# Patient Record
Sex: Male | Born: 1960 | Hispanic: No | Marital: Married | Smoking: Never smoker
Health system: Southern US, Community
[De-identification: ages and names within clinical notes are randomized; demographics above are authoritative.]

## PROBLEM LIST (undated history)

## (undated) DIAGNOSIS — I214 Non-ST elevation (NSTEMI) myocardial infarction: Secondary | ICD-10-CM

## (undated) DIAGNOSIS — E785 Hyperlipidemia, unspecified: Secondary | ICD-10-CM

## (undated) HISTORY — PX: KNEE ARTHROSCOPY WITH ANTERIOR CRUCIATE LIGAMENT (ACL) REPAIR: SHX5644

## (undated) HISTORY — PX: KIDNEY SURGERY: SHX687

---

## 2019-02-20 ENCOUNTER — Emergency Department (HOSPITAL_COMMUNITY): Payer: BC Managed Care – PPO

## 2019-02-20 ENCOUNTER — Inpatient Hospital Stay (HOSPITAL_COMMUNITY)
Admission: EM | Admit: 2019-02-20 | Discharge: 2019-02-23 | DRG: 247 | Disposition: A | Discharge status: Home / Self Care | Payer: BC Managed Care – PPO | Attending: Cardiovascular Disease | Admitting: Cardiovascular Disease

## 2019-02-20 ENCOUNTER — Other Ambulatory Visit: Payer: Self-pay

## 2019-02-20 ENCOUNTER — Encounter (HOSPITAL_COMMUNITY): Payer: Self-pay

## 2019-02-20 DIAGNOSIS — I959 Hypotension, unspecified: Secondary | ICD-10-CM | POA: Diagnosis present

## 2019-02-20 DIAGNOSIS — E785 Hyperlipidemia, unspecified: Secondary | ICD-10-CM | POA: Diagnosis present

## 2019-02-20 DIAGNOSIS — R001 Bradycardia, unspecified: Secondary | ICD-10-CM | POA: Diagnosis present

## 2019-02-20 DIAGNOSIS — Z955 Presence of coronary angioplasty implant and graft: Secondary | ICD-10-CM

## 2019-02-20 DIAGNOSIS — Z7982 Long term (current) use of aspirin: Secondary | ICD-10-CM

## 2019-02-20 DIAGNOSIS — E7800 Pure hypercholesterolemia, unspecified: Secondary | ICD-10-CM | POA: Diagnosis not present

## 2019-02-20 DIAGNOSIS — I214 Non-ST elevation (NSTEMI) myocardial infarction: Principal | ICD-10-CM | POA: Diagnosis present

## 2019-02-20 DIAGNOSIS — Z8249 Family history of ischemic heart disease and other diseases of the circulatory system: Secondary | ICD-10-CM

## 2019-02-20 DIAGNOSIS — I2510 Atherosclerotic heart disease of native coronary artery without angina pectoris: Secondary | ICD-10-CM | POA: Diagnosis present

## 2019-02-20 DIAGNOSIS — Z79899 Other long term (current) drug therapy: Secondary | ICD-10-CM | POA: Diagnosis not present

## 2019-02-20 HISTORY — DX: Hyperlipidemia, unspecified: E78.5

## 2019-02-20 HISTORY — DX: Non-ST elevation (NSTEMI) myocardial infarction: I21.4

## 2019-02-20 LAB — COMPREHENSIVE METABOLIC PANEL
ALT: 44 U/L (ref 0–44)
AST: 45 U/L — ABNORMAL HIGH (ref 15–41)
Albumin: 3.9 g/dL (ref 3.5–5.0)
Alkaline Phosphatase: 48 U/L (ref 38–126)
Anion gap: 9 (ref 5–15)
BUN: 15 mg/dL (ref 6–20)
CO2: 23 mmol/L (ref 22–32)
Calcium: 8.9 mg/dL (ref 8.9–10.3)
Chloride: 101 mmol/L (ref 98–111)
Creatinine, Ser: 0.93 mg/dL (ref 0.61–1.24)
GFR calc Af Amer: >60 mL/min (ref >60)
GFR calc non Af Amer: >60 mL/min (ref >60)
Glucose, Bld: 142 mg/dL — ABNORMAL HIGH (ref 70–99)
Potassium: 3.8 mmol/L (ref 3.5–5.1)
Sodium: 133 mmol/L — ABNORMAL LOW (ref 135–145)
Total Bilirubin: 0.6 mg/dL (ref 0.3–1.2)
Total Protein: 6.8 g/dL (ref 6.5–8.1)

## 2019-02-20 LAB — TROPONIN I: Troponin I: 0.17 ng/mL (ref <0.03)

## 2019-02-20 LAB — CBG MONITORING, ED: Glucose-Capillary: 154 mg/dL — ABNORMAL HIGH (ref 70–99)

## 2019-02-20 LAB — D-DIMER, QUANTITATIVE: D-Dimer, Quant: 0.44 ug/mL-FEU (ref 0.00–0.50)

## 2019-02-20 LAB — CBC
HCT: 42.9 % (ref 39.0–52.0)
Hemoglobin: 14.4 g/dL (ref 13.0–17.0)
MCH: 28.6 pg (ref 26.0–34.0)
MCHC: 33.6 g/dL (ref 30.0–36.0)
MCV: 85.1 fL (ref 80.0–100.0)
Platelets: 184 10*3/uL (ref 150–400)
RBC: 5.04 MIL/uL (ref 4.22–5.81)
RDW: 12.8 % (ref 11.5–15.5)
WBC: 8.5 10*3/uL (ref 4.0–10.5)
nRBC: 0 % (ref 0.0–0.2)

## 2019-02-20 LAB — LIPASE, BLOOD: Lipase: 47 U/L (ref 11–51)

## 2019-02-20 MED ORDER — HEPARIN BOLUS VIA INFUSION
4000.0000 [IU] | Freq: Once | INTRAVENOUS | Status: AC
  Administered 2019-02-20: 4000 [IU] via INTRAVENOUS
  Filled 2019-02-20: qty 4000

## 2019-02-20 MED ORDER — ASPIRIN 81 MG PO CHEW: 243.0000 mg | CHEWABLE_TABLET | Freq: Once | ORAL | Status: DC

## 2019-02-20 MED ORDER — NITROGLYCERIN 0.4 MG SL SUBL: 0.4000 mg | SUBLINGUAL_TABLET | SUBLINGUAL | Status: DC | PRN

## 2019-02-20 MED ORDER — ASPIRIN EC 81 MG PO TBEC
81.0000 mg | DELAYED_RELEASE_TABLET | Freq: Every day | ORAL | Status: DC
  Administered 2019-02-21 – 2019-02-23 (×2): 81 mg via ORAL
  Filled 2019-02-20 (×2): qty 1

## 2019-02-20 MED ORDER — ASPIRIN 81 MG PO CHEW
324.0000 mg | CHEWABLE_TABLET | Freq: Once | ORAL | Status: AC
  Administered 2019-02-20: 324 mg via ORAL
  Filled 2019-02-20: qty 4

## 2019-02-20 MED ORDER — ONDANSETRON HCL 4 MG/2ML IJ SOLN: 4.0000 mg | Freq: Four times a day (QID) | INTRAMUSCULAR | Status: DC | PRN

## 2019-02-20 MED ORDER — HEPARIN (PORCINE) 25000 UT/250ML-% IV SOLN
900.0000 [IU]/h | INTRAVENOUS | Status: DC
  Administered 2019-02-20 – 2019-02-21 (×2): 900 [IU]/h via INTRAVENOUS
  Filled 2019-02-20 (×2): qty 250

## 2019-02-20 MED ORDER — ATORVASTATIN CALCIUM 80 MG PO TABS
80.0000 mg | ORAL_TABLET | Freq: Every day | ORAL | Status: DC
  Administered 2019-02-20 – 2019-02-22 (×3): 80 mg via ORAL
  Filled 2019-02-20 (×3): qty 1

## 2019-02-20 MED ORDER — ACETAMINOPHEN 325 MG PO TABS: 650.0000 mg | ORAL_TABLET | ORAL | Status: DC | PRN

## 2019-02-20 MED ORDER — LACTATED RINGERS IV BOLUS
1000.0000 mL | Freq: Once | INTRAVENOUS | Status: AC
  Administered 2019-02-20: 1000 mL via INTRAVENOUS

## 2019-02-20 NOTE — ED Provider Notes (Signed)
[facility] EMERGENCY DEPARTMENT Provider Note   CSN: [id number] Arrival date & time: 02/20/19  1843    History   Chief Complaint Chief Complaint  Patient presents with  . Chest Pain    HPI RONNELL CLINGER is a 58 y.o. male who presents the emergency department complaining of centrally located chest tightness that started 9 hours ago.  Patient states that he ran 2 miles on the treadmill last night and felt fine.  He woke up this morning and did 2 sets of push-ups and then went for a walk.  He states that while on the walk he began to feel a tightness located in the center portion of his chest which does not radiate to any other location.  He states that he was unable to finish the walk secondary to the pain.  He states that it has been constant since onset with some waxing and waning however it has remained persistent.  He denies any shortness of breath, cough, diaphoresis associated with it.  He also denies any recent fevers, chills, nausea/vomiting/diarrhea, abdominal pain, or changes in bowel or bladder habits.  No recent travel, unilateral lower extremity swelling, or history of blood clots.      Illness  Onset quality:  Sudden Duration:  9 hours Timing:  Constant Progression:  Waxing and waning Chronicity:  New Associated symptoms: chest pain   Associated symptoms: no abdominal pain, no cough, no ear pain, no fever, no rash, no shortness of breath, no sore throat and no vomiting     History reviewed. No pertinent past medical history.  Patient Active Problem List   Diagnosis Date Noted  . NSTEMI (non-ST elevated myocardial infarction) ([place]) 02/20/2019    Past Surgical History:  Procedure Laterality Date  . KIDNEY SURGERY    . KNEE ARTHROSCOPY WITH ANTERIOR CRUCIATE LIGAMENT (ACL) REPAIR          Home Medications    Prior to Admission medications   Medication Sig Start Date End Date Taking? Authorizing Provider  aspirin EC 81 MG tablet Take 81 mg  by mouth daily.   Yes [provider]  escitalopram (LEXAPRO) 10 MG tablet Take 5 mg by mouth daily.   Yes [provider]  Multiple Vitamin (MULTIVITAMIN WITH MINERALS) TABS tablet Take 1 tablet by mouth daily.   Yes [provider]    Family History No family history on file.  Social History Social History   Tobacco Use  . Smoking status: Never Smoker  . Smokeless tobacco: Never Used  Substance Use Topics  . Alcohol use: Yes    Alcohol/week: 3.0 standard drinks    Types: 3 Glasses of wine per week  . Drug use: Never     Allergies   Patient has no known allergies.   Review of Systems Review of Systems  Constitutional: Negative for chills and fever.  HENT: Negative for ear pain and sore throat.   Eyes: Negative for pain and visual disturbance.  Respiratory: Positive for chest tightness. Negative for cough and shortness of breath.   Cardiovascular: Positive for chest pain. Negative for palpitations.  Gastrointestinal: Negative for abdominal pain and vomiting.  Genitourinary: Negative for dysuria and hematuria.  Musculoskeletal: Negative for arthralgias and back pain.  Skin: Negative for color change and rash.  Neurological: Negative for seizures and syncope.  All other systems reviewed and are negative.    Physical Exam Updated Vital Signs BP 98/60   Pulse (!) 47   Temp 97.9  F (36.6 C) (Oral)   Resp 15   Ht 5\' 9"  (1.753 m)   Wt 78 kg   SpO2 100%   BMI 25.40 kg/m   Physical Exam Vitals signs and nursing note reviewed.  Constitutional:      Appearance: He is well-developed.  HENT:     Head: Normocephalic and atraumatic.  Eyes:     Conjunctiva/sclera: Conjunctivae normal.  Neck:     Musculoskeletal: Neck supple.  Cardiovascular:     Rate and Rhythm: Normal rate and regular rhythm.     Heart sounds: No murmur.  Pulmonary:     Effort: Pulmonary effort is normal. No respiratory distress.     Breath sounds: Normal breath  sounds.  Abdominal:     Palpations: Abdomen is soft.     Tenderness: There is no abdominal tenderness.  Musculoskeletal:     Right lower leg: No edema.     Left lower leg: No edema.     Comments: No tenderness palpation over the chest wall.  Skin:    General: Skin is warm and dry.  Neurological:     General: No focal deficit present.     Mental Status: He is alert and oriented to person, place, and time.     Cranial Nerves: No cranial nerve deficit.      ED Treatments / Results  Labs (all labs ordered are listed, but only abnormal results are displayed) Labs Reviewed  TROPONIN I - Abnormal; Notable for the following components:      Result Value   Troponin I 0.17 (*)    All other components within normal limits  COMPREHENSIVE METABOLIC PANEL - Abnormal; Notable for the following components:   Sodium 133 (*)    Glucose, Bld 142 (*)    AST 45 (*)    All other components within normal limits  CBG MONITORING, ED - Abnormal; Notable for the following components:   Glucose-Capillary 154 (*)    All other components within normal limits  CBC  LIPASE, BLOOD  D-DIMER, QUANTITATIVE (NOT AT [facility])  HEPARIN LEVEL (UNFRACTIONATED)  CBC  HEMOGLOBIN F5D  BASIC METABOLIC PANEL  LIPID PANEL  TROPONIN I  HIV ANTIBODY (ROUTINE TESTING W REFLEX)    EKG EKG Interpretation  Date/Time:  Saturday February 20 2019 20:12:48 EDT Ventricular Rate:  50 PR Interval:    QRS Duration: 96 QT Interval:  438 QTC Calculation: 400 R Axis:   72 Text Interpretation:  Sinus rhythm Minimal ST depression, inferior leads No significant change since last tracing Confirmed by Deno Etienne [phone]) on 02/20/2019 9:12:42 PM   Radiology Dg Chest Portable 1 View  Result Date: 02/20/2019 CLINICAL DATA:  Chest pain. EXAM: PORTABLE CHEST 1 VIEW COMPARISON:  None. FINDINGS: Lungs are adequately inflated and otherwise clear. Cardiomediastinal silhouette and remainder of the exam is normal. IMPRESSION: No active  disease. Electronically Signed   By: Marin Olp M.D.   On: 02/20/2019 20:00    Procedures Procedures (including critical care time)  Medications Ordered in ED Medications  heparin bolus via infusion 4,000 Units (has no administration in time range)  heparin ADULT infusion 100 units/mL (25000 units/231mL sodium chloride 0.45%) (has no administration in time range)  aspirin EC tablet 81 mg (has no administration in time range)  acetaminophen (TYLENOL) tablet 650 mg (has no administration in time range)  ondansetron (ZOFRAN) injection 4 mg (has no administration in time range)  atorvastatin (LIPITOR) tablet 80 mg (has no administration in time range)  aspirin  chewable tablet 324 mg (324 mg Oral Given 02/20/19 1941)  lactated ringers bolus 1,000 mL (1,000 mLs Intravenous New Bag/Given 02/20/19 2015)     Initial Impression / Assessment and Plan / ED Course  I have reviewed the triage vital signs and the nursing notes.  Pertinent labs & imaging results that were available during my care of the patient were reviewed by me and considered in my medical decision making (see chart for details).        Patient is a relatively healthy 58 year old male who presents to the emergency department complaining of sudden onset pressure-like chest pain that started while walking earlier today.  He states that he did push-ups prior to onset of pain and thought initially that it was musculoskeletal in nature.  He came to the emergency department as the pain has been persistent now for the past 9 hours and has been unrelenting.  On initial evaluation of the patient he was hemodynamically stable and nontoxic-appearing.  Patient was afebrile, not tachycardic, normotensive, satting appropriately on room air.  Physical exam as detailed above which is remarkable for healthy-appearing middle-aged male in no significant distress.  Lungs are clear to auscultation.  No murmurs rubs or gallops are appreciated.  Equal  pulses in all 4 extremities and no lower extremity edema is present.  Given patient's description of symptoms there is significant concern for potential ACS.  EKG was obtained which shows normal sinus rhythm at 58 bpm.  No evidence of WPW or Brugada.  No ST or T wave abnormalities concerning for acute ischemia. Full dose aspirin given.   CBC and CMP largely markable.  Patient has d-dimer that is within normal limits however troponin is elevated at 0.17.  Heparin infusion initiated.  While emergency department he had additional episode of chest pain and repeat EKG was obtained at that time.  EKG shows sinus bradycardia at 50 bpm.  No ST or T wave abnormalities concerning for acute ischemia.  Patient's blood pressure also began to downtrend so IV fluid bolus was given.  Case was discussed with Dr. Charissa Bash who is in agreement with admission at this time for ongoing evaluation and care of NSTEMI.  Patient was in stable condition at time admission.  Final Clinical Impressions(s) / ED Diagnoses   Final diagnoses:  NSTEMI (non-ST elevated myocardial infarction) [facility])    ED Discharge Orders    None       Tommie Raymond, MD 02/20/19 2131    Deno Etienne, DO 02/20/19 2220

## 2019-02-20 NOTE — ED Notes (Signed)
ED TO INPATIENT HANDOFF REPORT  ED Nurse Name and Phone #: phill Jelene Albano [phone]  S Name/Age/Gender Alexander Le 58 y.o. male Room/Bed: 019C/019C  Code Status   Code Status: Full Code  Home/SNF/Other Home Patient oriented to: self, place, time and situation Is this baseline? Yes   Triage Complete: Triage complete  Chief Complaint Chest Pain  Triage Note Pt from home c/o CP that began this am while exercising; described as heaviness, tightness, located centrally, states it radiates to RIGHT arm (pt states "it just doesn't feel right"); denies sob, diaphoresis; denies being around known sick persons, no covid-19 positive contacts   Allergies No Known Allergies  Level of Care/Admitting Diagnosis ED Disposition    ED Disposition Condition [place]: [place] [[id number]]  Level of Care: Telemetry Cardiac [103]  Diagnosis: NSTEMI (non-ST elevated myocardial infarction) [facility]) [[id number]]  Admitting Physician: Ailene Ards  Attending Physician: Ailene Ards  Estimated length of stay: 3 - 4 days  Certification:: I certify this patient will need inpatient services for at least 2 midnights  PT Class (Do Not Modify): Inpatient [101]  PT Acc Code (Do Not Modify): Private [1]       B Medical/Surgery History History reviewed. No pertinent past medical history. Past Surgical History:  Procedure Laterality Date  . KIDNEY SURGERY    . KNEE ARTHROSCOPY WITH ANTERIOR CRUCIATE LIGAMENT (ACL) REPAIR       A IV Location/Drains/Wounds Patient Lines/Drains/Airways Status   Active Line/Drains/Airways    Name:   Placement date:   Placement time:   Site:   Days:   Peripheral IV 02/20/19 Right Antecubital   02/20/19    1902    Antecubital   less than 1          Intake/Output Last 24 hours No intake or output data in the 24 hours ending 02/20/19 2105  Labs/Imaging Results for orders placed or performed during the  hospital encounter of 02/20/19 (from the past 48 hour(s))  Troponin I - Now Then Q3H     Status: Abnormal   Collection Time: 02/20/19  7:06 PM  Result Value Ref Range   Troponin I 0.17 (HH) <0.03 ng/mL    Comment: CRITICAL RESULT CALLED TO, READ BACK BY AND VERIFIED WITH: Guerry Bruin RN @ 02/20/2019 BY TEMOCHE, H CALLED AT 2016 Performed at [facility], [place] [address], [place], [place] [address] CORRECTED ON 04/04 AT 2045: PREVIOUSLY REPORTED AS 0.17 CRITICAL RESULT CALLED TO, READ BACK BY AND VERIFIED WITH: Guerry Bruin RN @ 02/20/2019 BY TEMOCHE, H   CBC     Status: None   Collection Time: 02/20/19  7:06 PM  Result Value Ref Range   WBC 8.5 4.0 - 10.5 K/uL   RBC 5.04 4.22 - 5.81 MIL/uL   Hemoglobin 14.4 13.0 - 17.0 g/dL   HCT 42.9 39.0 - 52.0 %   MCV 85.1 80.0 - 100.0 fL   MCH 28.6 26.0 - 34.0 pg   MCHC 33.6 30.0 - 36.0 g/dL   RDW 12.8 11.5 - 15.5 %   Platelets 184 150 - 400 K/uL   nRBC 0.0 0.0 - 0.2 %    Comment: Performed at [facility], [place] [address] [place], [place] [address]  Comprehensive metabolic panel     Status: Abnormal   Collection Time: 02/20/19  7:06 PM  Result Value Ref Range   Sodium 133 (L) 135 - 145 mmol/L  Potassium 3.8 3.5 - 5.1 mmol/L   Chloride 101 98 - 111 mmol/L   CO2 23 22 - 32 mmol/L   Glucose, Bld 142 (H) 70 - 99 mg/dL   BUN 15 6 - 20 mg/dL   Creatinine, Ser 0.93 0.61 - 1.24 mg/dL   Calcium 8.9 8.9 - 10.3 mg/dL   Total Protein 6.8 6.5 - 8.1 g/dL   Albumin 3.9 3.5 - 5.0 g/dL   AST 45 (H) 15 - 41 U/L   ALT 44 0 - 44 U/L   Alkaline Phosphatase 48 38 - 126 U/L   Total Bilirubin 0.6 0.3 - 1.2 mg/dL   GFR calc non Af Amer >60 >60 mL/min   GFR calc Af Amer >60 >60 mL/min   Anion gap 9 5 - 15    Comment: Performed at [facility], [address] [place], [place] [address]  Lipase, blood     Status: None   Collection Time: 02/20/19  7:06 PM  Result Value Ref Range   Lipase 47 11 - 51 U/L    Comment: Performed at [facility] [address] [place], [place] [address]  CBG monitoring, ED     Status: Abnormal   Collection Time: 02/20/19  7:21 PM  Result Value Ref Range   Glucose-Capillary 154 (H) 70 - 99 mg/dL   Comment 1 Notify RN    Comment 2 Document in Chart   D-dimer, quantitative (not at [facility])     Status: None   Collection Time: 02/20/19  8:27 PM  Result Value Ref Range   D-Dimer, Quant 0.44 0.00 - 0.50 ug/mL-FEU    Comment: (NOTE) At the manufacturer cut-off of 0.50 ug/mL FEU, this assay has been documented to exclude PE with a sensitivity and negative predictive value of 97 to 99%.  At this time, this assay has not been approved by the FDA to exclude DVT/VTE. Results should be correlated with clinical presentation. Performed at [facility], [place] [address] [place], [place] [address]    Dg Chest Portable 1 View  Result Date: 02/20/2019 CLINICAL DATA:  Chest pain. EXAM: PORTABLE CHEST 1 VIEW COMPARISON:  None. FINDINGS: Lungs are adequately inflated and otherwise clear. Cardiomediastinal silhouette and remainder of the exam is normal. IMPRESSION: No active disease. Electronically Signed   By: Marin Olp M.D.   On: 02/20/2019 20:00    Pending Labs Unresulted Labs (From admission, onward)    Start     Ordered   02/22/19 0500  Heparin level (unfractionated)  Daily,   R     02/20/19 2032   02/21/19 0600  Lipid panel  Once,   R     02/20/19 2049   02/21/19 0600  Hemoglobin A1c  Once,   R     02/20/19 2049   02/21/19 4967  Basic metabolic panel  Once,   R     02/20/19 2049   02/21/19 0500  CBC  Daily,   R     02/20/19 2030   02/21/19 0300  Heparin level (unfractionated)  Once-Timed,   R     02/20/19 2030   02/20/19 2045  HIV antibody (Routine Testing)  Once,   R     02/20/19 2046   02/20/19 1908  Troponin I - Now Then Q3H  Now then every 3 hours,   STAT     02/20/19 1909          Vitals/Pain Today's Vitals   02/20/19  1856 02/20/19 1859 02/20/19 1930 02/20/19 1946  BP:    105/61   Pulse:   (!) 44   Resp:   17   Temp:  97.9 F (36.6 C)    TempSrc:  Oral    SpO2:   98%   Weight:  78 kg    Height:  5\' 9"  (1.753 m)    PainSc: 4    5     Isolation Precautions No active isolations  Medications Medications  heparin bolus via infusion 4,000 Units (has no administration in time range)  heparin ADULT infusion 100 units/mL (25000 units/269mL sodium chloride 0.45%) (has no administration in time range)  aspirin EC tablet 81 mg (has no administration in time range)  nitroGLYCERIN (NITROSTAT) SL tablet 0.4 mg (has no administration in time range)  acetaminophen (TYLENOL) tablet 650 mg (has no administration in time range)  ondansetron (ZOFRAN) injection 4 mg (has no administration in time range)  aspirin chewable tablet 324 mg (324 mg Oral Given 02/20/19 1941)  lactated ringers bolus 1,000 mL (1,000 mLs Intravenous New Bag/Given 02/20/19 2015)    Mobility walks     Focused Assessments Cardiac Assessment Handoff:  Cardiac Rhythm: Normal sinus rhythm Lab Results  Component Value Date   TROPONINI 0.17 (HH) 02/20/2019   Lab Results  Component Value Date   DDIMER 0.44 02/20/2019   Does the Patient currently have chest pain? Yes     R Recommendations: See Admitting Provider Note  Report given to:   Additional Notes: Positive troponin levels.0.17

## 2019-02-20 NOTE — Progress Notes (Signed)
Wife: Izik Bingman, cell: [phone]--[facility] of no visitor policy

## 2019-02-20 NOTE — H&P (Signed)
Cardiology History & Physical    Patient ID: Alexander Le MRN: [id number], DOB: 12-30-1960 Date of Encounter: 02/20/2019, 8:47 PM Primary Physician: System, Pcp Not In Primary Cardiologist: No primary care provider on file. Primary Electrophysiologist:  None  Chief Complaint: chest pressure Reason for Admission: NSTEMI Requesting MD: Tommie Raymond, MD  HPI: Alexander Le is a 59 y.o. male with no significant past medical history who presents for evaluation of chest pressure.  Patient did a couple of sets of push-ups this morning.  He then went for a walk with his wife.  During the walk he developed midsternal chest pressure and difficulty breathing.  He was unable to complete the walk, and his wife had to go get the car and pick him up.  He then came to the emergency room for further evaluation.  EKG here showed sinus rhythm with minimal ST depression in the inferior leads.  Initial troponin came back 0.17.  Vital signs notable for mild bradycardia and mild hypotension.  Patient is unclear what his baseline blood pressure is, but thinks it is lower than normal as he has experienced some lightheadedness while here.  He was given 324 mg aspirin and IV heparin has been ordered.  Currently his chest pain has resolved.  It has happened intermittently a few times while in the ED.  He denies tobacco use or history of hypertension or diabetes.  His mother had heart problems in her 18s, and he has a brother that had a heart attack in his 83s.  History reviewed. No pertinent past medical history.   Surgical History:  Past Surgical History:  Procedure Laterality Date  . KIDNEY SURGERY    . KNEE ARTHROSCOPY WITH ANTERIOR CRUCIATE LIGAMENT (ACL) REPAIR       Home Meds: Prior to Admission medications   Medication Sig Start Date End Date Taking? Authorizing Provider  aspirin EC 81 MG tablet Take 81 mg by mouth daily.   Yes [provider]  PRESCRIPTION MEDICATION Antidepressant filled  overseas   Yes [provider]    Allergies: No Known Allergies   Social history: No tobacco use.  He works in [employer] and is working from home.  Family history: See HPI  Review of Systems: General: negative for chills, fever, night sweats or weight changes.  Cardiovascular: Positive for chest pain Dermatological: negative for rash Respiratory: negative for cough or wheezing Urologic: negative for hematuria Abdominal: negative for nausea, vomiting, diarrhea, bright red blood per rectum, melena, or hematemesis Neurologic: negative for visual changes, syncope, or dizziness All other systems reviewed and are otherwise negative except as noted above.  Labs:   Lab Results  Component Value Date   WBC 8.5 02/20/2019   HGB 14.4 02/20/2019   HCT 42.9 02/20/2019   MCV 85.1 02/20/2019   PLT 184 02/20/2019    Recent Labs  Lab 02/20/19 1906  NA 133*  K 3.8  CL 101  CO2 23  BUN 15  CREATININE 0.93  CALCIUM 8.9  PROT 6.8  BILITOT 0.6  ALKPHOS 48  ALT 44  AST 45*  GLUCOSE 142*   Recent Labs    02/20/19 1906  TROPONINI 0.17*   No results found for: CHOL, HDL, LDLCALC, TRIG No results found for: DDIMER  Radiology/Studies:  Dg Chest Portable 1 View  Result Date: 02/20/2019 CLINICAL DATA:  Chest pain. EXAM: PORTABLE CHEST 1 VIEW COMPARISON:  None. FINDINGS: Lungs are adequately inflated and otherwise clear. Cardiomediastinal silhouette and remainder of the  exam is normal. IMPRESSION: No active disease. Electronically Signed   By: Marin Olp M.D.   On: 02/20/2019 20:00   Wt Readings from Last 3 Encounters:  02/20/19 78 kg    EKG: See HPI  Physical Exam: Blood pressure 105/61, pulse (!) 44, temperature 97.9 F (36.6 C), temperature source Oral, resp. rate 17, height 5\' 9"  (1.753 m), weight 78 kg, SpO2 98 %. Body mass index is 25.4 kg/m. General: Well developed, well nourished, in no acute distress. Head: Normocephalic, atraumatic, sclera non-icteric, no  xanthomas, nares are without discharge.  Neck: JVD not elevated. Lungs: Breathing is unlabored. Heart: Auscultation not performed Abdomen: Nondistended Msk:  Strength and tone appear normal for age. Extremities: No clubbing or cyanosis. No edema.  Distal pedal pulses are 2+ and equal bilaterally. Neuro: Alert and oriented X 3. No focal deficit. No facial asymmetry. Moves all extremities spontaneously. Psych:  Responds to questions appropriately with a normal affect.    Assessment and Plan  1.  NSTEMI Typical chest pressure symptoms with elevated troponin.  EKG is normal appearing with perhaps small ST segment depression inferiorly, not meeting criteria for ischemia.  Only risk factor is family history with a brother with MI in his 59s.  We will plan IV heparin and echocardiogram tomorrow.  Likely cath on Monday.  Currently symptom-free.  Probably will not tolerate beta-blocker or nitroglycerin with current vital signs.  --IV heparin for ACS --Echocardiogram --Likely cath on Monday --Aspirin 81, atorvastatin 80 --We will get lipid panel, A1c, and repeat troponin tomorrow morning  Severity of Illness: The appropriate patient status for this patient is INPATIENT. Inpatient status is judged to be reasonable and necessary in order to provide the required intensity of service to ensure the patient's safety. The patient's presenting symptoms, physical exam findings, and initial radiographic and laboratory data in the context of their chronic comorbidities is felt to place them at high risk for further clinical deterioration. Furthermore, it is not anticipated that the patient will be medically stable for discharge from the hospital within 2 midnights of admission. The following factors support the patient status of inpatient.   " The patient's presenting symptoms include chest pain. " The worrisome physical exam findings include none. " The initial radiographic and laboratory data are worrisome  because of abnormal troponin. " The chronic co-morbidities include none.   * I certify that at the point of admission it is my clinical judgment that the patient will require inpatient hospital care spanning beyond 2 midnights from the point of admission due to high intensity of service, high risk for further deterioration and high frequency of surveillance required.*    For questions or updates, please contact [place] Please consult [email or web address] for contact info under Cardiology/STEMI.  Liliane Bade, MD 02/20/2019, 8:47 PM

## 2019-02-20 NOTE — Progress Notes (Signed)
Pt C/O "chest heaviness" started today after run and multiple sets of push-ups.  Denies SOB, fever, cough.

## 2019-02-20 NOTE — ED Triage Notes (Signed)
Pt from home c/o CP that began this am while exercising; described as heaviness, tightness, located centrally, states it radiates to RIGHT arm (pt states "it just doesn't feel right"); denies sob, diaphoresis; denies being around known sick persons, no covid-19 positive contacts

## 2019-02-20 NOTE — Progress Notes (Signed)
ANTICOAGULATION CONSULT NOTE - Initial Consult  Pharmacy Consult for heparin Indication: chest pain/ACS  No Known Allergies  Patient Measurements: Height: 5\' 9"  (175.3 cm) Weight: 172 lb (78 kg) IBW/kg (Calculated) : 70.7 Heparin Dosing Weight: 78 kg  Vital Signs: Temp: 97.9 F (36.6 C) (04/04 1859) Temp Source: Oral (04/04 1859) BP: 105/61 (04/04 1930) Pulse Rate: 44 (04/04 1930)  Labs: Recent Labs    02/20/19 1906  HGB 14.4  HCT 42.9  PLT 184  CREATININE 0.93  TROPONINI 0.17*    Estimated Creatinine Clearance: 88.7 mL/min (by C-G formula based on SCr of 0.93 mg/dL).   Medical History: History reviewed. No pertinent past medical history.  Assessment: 43 yom complaining of waxing/waning chest pain s/p exercising. No AC PTA.  Hgb 14.4, plt 184. Troponin 0.17. No s/sx of bleeding.   Goal of Therapy:  Heparin level 0.3-0.7 units/ml Monitor platelets by anticoagulation protocol: Yes   Plan:  Give 4000 units bolus x 1 Start heparin infusion at 900 units/hr Check anti-Xa level in 6 hours and daily while on heparin Continue to monitor H&H and platelets  Antonietta Jewel, PharmD, [facility] Clinical Pharmacist  Pager: [phone] Phone: [phone] 02/20/2019,8:27 PM

## 2019-02-21 ENCOUNTER — Encounter (HOSPITAL_COMMUNITY): Payer: Self-pay

## 2019-02-21 ENCOUNTER — Inpatient Hospital Stay (HOSPITAL_COMMUNITY): Payer: BC Managed Care – PPO

## 2019-02-21 DIAGNOSIS — I214 Non-ST elevation (NSTEMI) myocardial infarction: Secondary | ICD-10-CM

## 2019-02-21 LAB — BASIC METABOLIC PANEL
Anion gap: 10 (ref 5–15)
BUN: 16 mg/dL (ref 6–20)
CO2: 22 mmol/L (ref 22–32)
Calcium: 8.8 mg/dL — ABNORMAL LOW (ref 8.9–10.3)
Chloride: 103 mmol/L (ref 98–111)
Creatinine, Ser: 1.04 mg/dL (ref 0.61–1.24)
GFR calc Af Amer: >60 mL/min (ref >60)
GFR calc non Af Amer: >60 mL/min (ref >60)
Glucose, Bld: 114 mg/dL — ABNORMAL HIGH (ref 70–99)
Potassium: 4.4 mmol/L (ref 3.5–5.1)
Sodium: 135 mmol/L (ref 135–145)

## 2019-02-21 LAB — LIPID PANEL
Cholesterol: 206 mg/dL — ABNORMAL HIGH (ref 0–200)
HDL: 46 mg/dL (ref >40)
LDL Cholesterol: 142 mg/dL — ABNORMAL HIGH (ref 0–99)
Total CHOL/HDL Ratio: 4.5 RATIO
Triglycerides: 89 mg/dL (ref <150)
VLDL: 18 mg/dL (ref 0–40)

## 2019-02-21 LAB — CBC
HCT: 40.2 % (ref 39.0–52.0)
Hemoglobin: 13.3 g/dL (ref 13.0–17.0)
MCH: 28.1 pg (ref 26.0–34.0)
MCHC: 33.1 g/dL (ref 30.0–36.0)
MCV: 84.8 fL (ref 80.0–100.0)
Platelets: 156 10*3/uL (ref 150–400)
RBC: 4.74 MIL/uL (ref 4.22–5.81)
RDW: 12.9 % (ref 11.5–15.5)
WBC: 9 10*3/uL (ref 4.0–10.5)
nRBC: 0.3 % — ABNORMAL HIGH (ref 0.0–0.2)

## 2019-02-21 LAB — HEMOGLOBIN A1C
Hgb A1c MFr Bld: 4.9 % (ref 4.8–5.6)
Mean Plasma Glucose: 93.93 mg/dL

## 2019-02-21 LAB — ECHOCARDIOGRAM LIMITED
Height: 69 in
Weight: 2798.4 oz

## 2019-02-21 LAB — HEPARIN LEVEL (UNFRACTIONATED)
Heparin Unfractionated: 0.39 IU/mL (ref 0.30–0.70)
Heparin Unfractionated: 0.38 IU/mL (ref 0.30–0.70)

## 2019-02-21 LAB — HIV ANTIBODY (ROUTINE TESTING W REFLEX): HIV Screen 4th Generation wRfx: NONREACTIVE

## 2019-02-21 LAB — TROPONIN I: Troponin I: 2.81 ng/mL (ref <0.03)

## 2019-02-21 MED ORDER — SODIUM CHLORIDE 0.9 % IV BOLUS
250.0000 mL | Freq: Once | INTRAVENOUS | Status: AC
  Administered 2019-02-21: 18:00:00 250 mL via INTRAVENOUS

## 2019-02-21 MED ORDER — SODIUM CHLORIDE 0.9% FLUSH: 3.0000 mL | Freq: Two times a day (BID) | INTRAVENOUS | Status: DC

## 2019-02-21 MED ORDER — SODIUM CHLORIDE 0.9% FLUSH: 3.0000 mL | INTRAVENOUS | Status: DC | PRN

## 2019-02-21 MED ORDER — ASPIRIN 81 MG PO CHEW
81.0000 mg | CHEWABLE_TABLET | ORAL | Status: AC
  Administered 2019-02-22: 06:00:00 81 mg via ORAL
  Filled 2019-02-21: qty 1

## 2019-02-21 MED ORDER — SODIUM CHLORIDE 0.9 % WEIGHT BASED INFUSION
1.0000 mL/kg/h | INTRAVENOUS | Status: DC
  Administered 2019-02-22: 1 mL/kg/h via INTRAVENOUS

## 2019-02-21 MED ORDER — SODIUM CHLORIDE 0.9 % IV SOLN: 250.0000 mL | INTRAVENOUS | Status: DC | PRN

## 2019-02-21 NOTE — Progress Notes (Signed)
BP went up to 93/67 after 250 ml bolos.  Idolina Primer, RN

## 2019-02-21 NOTE — Progress Notes (Deleted)
Pt stood up with a walker and 2 person assist and transferred in a recliner.  Pt having lunch in a recliner.  Purewick leaked on a bed.  The output is not accurate.   Idolina Primer, RN

## 2019-02-21 NOTE — Progress Notes (Signed)
CRITICAL VALUE ALERT Troponin Critical Value:  2.81  Date & Time Notied:  02/21/2019  0451  Provider Notified: Rhae Hammock  Orders Received/Actions taken:

## 2019-02-21 NOTE — Progress Notes (Signed)
SBP low in 70s. Checked manually. Received order to give 240ml of NS bolos x1 from Dr. Raiford Simmonds. Will continue to monitor.  Idolina Primer, RN

## 2019-02-21 NOTE — Progress Notes (Signed)
ANTICOAGULATION CONSULT NOTE - Follow Up Consult  Pharmacy Consult for heparin Indication: NSTEMI   Labs: Recent Labs    02/20/19 1906 02/21/19 0237  HGB 14.4  --   HCT 42.9  --   PLT 184  --   HEPARINUNFRC  --  0.39  CREATININE 0.93  --   TROPONINI 0.17*  --     Assessment/Plan:  58yo male therapeutic on heparin with initial dosing for NSTEMI. Will continue gtt at current rate and confirm stable with additional level.   Wynona Neat, PharmD, BCPS  02/21/2019,3:53 AM

## 2019-02-21 NOTE — Progress Notes (Signed)
  Echocardiogram 2D Echocardiogram has been performed.  Johny Chess 02/21/2019, 10:36 AM

## 2019-02-21 NOTE — Progress Notes (Signed)
ANTICOAGULATION CONSULT NOTE - Consult  Pharmacy Consult for heparin Indication: chest pain/ACS  No Known Allergies  Patient Measurements: Height: 5\' 9"  (175.3 cm) Weight: 174 lb 14.4 oz (79.3 kg) IBW/kg (Calculated) : 70.7 Heparin Dosing Weight: 78 kg  Vital Signs: Temp: 97.7 F (36.5 C) (04/05 0505) Temp Source: Oral (04/05 0505) BP: 90/61 (04/05 0505) Pulse Rate: 60 (04/05 0505)  Labs: Recent Labs    02/20/19 1906 02/21/19 0237 02/21/19 0757  HGB 14.4  --  13.3  HCT 42.9  --  40.2  PLT 184  --  156  HEPARINUNFRC  --  0.39 0.38  CREATININE 0.93 1.04  --   TROPONINI 0.17* 2.81*  --     Estimated Creatinine Clearance: 79.3 mL/min (by C-G formula based on SCr of 1.04 mg/dL).   Medical History: History reviewed. No pertinent past medical history.  Assessment: 23 yom complaining of waxing/waning chest pain s/p exercising. No AC PTA.  Hgb 13.3, plt 156. Troponin up to 2.81. No s/sx of bleeding.  Heparin level remains therapeutic this am.  Goal of Therapy:  Heparin level 0.3-0.7 units/ml Monitor platelets by anticoagulation protocol: Yes   Plan:  Continue heparin drip at 900 units/hr Will check heparin level and CBC qam  Alanda Slim, PharmD, [facility] Clinical Pharmacist Please see AMION for all Pharmacists' Contact Phone Numbers 02/21/2019, 8:52 AM

## 2019-02-21 NOTE — H&P (View-Only) (Signed)
Progress Note  Patient Name: Alexander Le Date of Encounter: 02/21/2019  Primary Cardiologist: Dr Stanford Breed  Subjective   No CP or dyspnea  Inpatient Medications    Scheduled Meds: . aspirin EC  81 mg Oral Daily  . atorvastatin  80 mg Oral q1800   Continuous Infusions: . heparin 900 Units/hr (02/20/19 2157)   PRN Meds: acetaminophen, ondansetron (ZOFRAN) IV   Vital Signs    Vitals:   02/20/19 2100 02/20/19 2110 02/20/19 2135 02/21/19 0505  BP: (!) 85/63 98/60 (!) 91/55 90/61  Pulse: (!) 47 (!) 47 (!) 57 60  Resp: 19 15    Temp:   (!) 97.4 F (36.3 C) 97.7 F (36.5 C)  TempSrc:   Oral Oral  SpO2: 99% 100% 99% 98%  Weight:    79.3 kg  Height:        Intake/Output Summary (Last 24 hours) at 02/21/2019 0743 Last data filed at 02/20/2019 2200 Gross per 24 hour  Intake 120 ml  Output -  Net 120 ml   Last 3 Weights 02/21/2019 02/20/2019  Weight (lbs) 174 lb 14.4 oz 172 lb  Weight (kg) 79.334 kg 78.019 kg      Telemetry    Sinus bradycardia- Personally Reviewed   Physical Exam   GEN: No acute distress.   Neck: No JVD Cardiac: RRR, no murmurs, rubs, or gallops.  Respiratory: Clear to auscultation bilaterally. GI: Soft, nontender, non-distended  MS: No edema Neuro:  Nonfocal  Psych: Normal affect   Labs    Chemistry Recent Labs  Lab 02/20/19 1906 02/21/19 0237  NA 133* 135  K 3.8 4.4  CL 101 103  CO2 23 22  GLUCOSE 142* 114*  BUN 15 16  CREATININE 0.93 1.04  CALCIUM 8.9 8.8*  PROT 6.8  --   ALBUMIN 3.9  --   AST 45*  --   ALT 44  --   ALKPHOS 48  --   BILITOT 0.6  --   GFRNONAA >60 >60  GFRAA >60 >60  ANIONGAP 9 10     Hematology Recent Labs  Lab 02/20/19 1906  WBC 8.5  RBC 5.04  HGB 14.4  HCT 42.9  MCV 85.1  MCH 28.6  MCHC 33.6  RDW 12.8  PLT 184    Cardiac Enzymes Recent Labs  Lab 02/20/19 1906 02/21/19 0237  TROPONINI 0.17* 2.81*    DDimer  Recent Labs  Lab 02/20/19 2027  DDIMER 0.44     Radiology    Dg  Chest Portable 1 View  Result Date: 02/20/2019 CLINICAL DATA:  Chest pain. EXAM: PORTABLE CHEST 1 VIEW COMPARISON:  None. FINDINGS: Lungs are adequately inflated and otherwise clear. Cardiomediastinal silhouette and remainder of the exam is normal. IMPRESSION: No active disease. Electronically Signed   By: Marin Olp M.D.   On: 02/20/2019 20:00    Patient Profile     58 y.o. male with no significant past medical history admitted with non-ST elevation myocardial infarction.  Assessment & Plan    1 non-ST elevation myocardial infarction-patient remains pain-free this morning.  Enzymes are abnormal.  Plan to proceed with cardiac catheterization tomorrow morning.  The risks and benefits including myocardial infarction, CVA and death discussed and he agrees to proceed.  Continue aspirin, heparin and statin.  Blood pressure and pulse will not allow beta-blocker.  Echocardiogram for LV function.  For questions or updates, please contact Center Please consult [email or web address] for contact info under  Signed, Kirk Ruths, MD  02/21/2019, 7:43 AM

## 2019-02-21 NOTE — Progress Notes (Signed)
Progress Note  Patient Name: Alexander Le Date of Encounter: 02/21/2019  Primary Cardiologist: Dr Stanford Breed  Subjective   No CP or dyspnea  Inpatient Medications    Scheduled Meds: . aspirin EC  81 mg Oral Daily  . atorvastatin  80 mg Oral q1800   Continuous Infusions: . heparin 900 Units/hr (02/20/19 2157)   PRN Meds: acetaminophen, ondansetron (ZOFRAN) IV   Vital Signs    Vitals:   02/20/19 2100 02/20/19 2110 02/20/19 2135 02/21/19 0505  BP: (!) 85/63 98/60 (!) 91/55 90/61  Pulse: (!) 47 (!) 47 (!) 57 60  Resp: 19 15    Temp:   (!) 97.4 F (36.3 C) 97.7 F (36.5 C)  TempSrc:   Oral Oral  SpO2: 99% 100% 99% 98%  Weight:    79.3 kg  Height:        Intake/Output Summary (Last 24 hours) at 02/21/2019 0743 Last data filed at 02/20/2019 2200 Gross per 24 hour  Intake 120 ml  Output -  Net 120 ml   Last 3 Weights 02/21/2019 02/20/2019  Weight (lbs) 174 lb 14.4 oz 172 lb  Weight (kg) 79.334 kg 78.019 kg      Telemetry    Sinus bradycardia- Personally Reviewed   Physical Exam   GEN: No acute distress.   Neck: No JVD Cardiac: RRR, no murmurs, rubs, or gallops.  Respiratory: Clear to auscultation bilaterally. GI: Soft, nontender, non-distended  MS: No edema Neuro:  Nonfocal  Psych: Normal affect   Labs    Chemistry Recent Labs  Lab 02/20/19 1906 02/21/19 0237  NA 133* 135  K 3.8 4.4  CL 101 103  CO2 23 22  GLUCOSE 142* 114*  BUN 15 16  CREATININE 0.93 1.04  CALCIUM 8.9 8.8*  PROT 6.8  --   ALBUMIN 3.9  --   AST 45*  --   ALT 44  --   ALKPHOS 48  --   BILITOT 0.6  --   GFRNONAA >60 >60  GFRAA >60 >60  ANIONGAP 9 10     Hematology Recent Labs  Lab 02/20/19 1906  WBC 8.5  RBC 5.04  HGB 14.4  HCT 42.9  MCV 85.1  MCH 28.6  MCHC 33.6  RDW 12.8  PLT 184    Cardiac Enzymes Recent Labs  Lab 02/20/19 1906 02/21/19 0237  TROPONINI 0.17* 2.81*    DDimer  Recent Labs  Lab 02/20/19 2027  DDIMER 0.44     Radiology    Dg  Chest Portable 1 View  Result Date: 02/20/2019 CLINICAL DATA:  Chest pain. EXAM: PORTABLE CHEST 1 VIEW COMPARISON:  None. FINDINGS: Lungs are adequately inflated and otherwise clear. Cardiomediastinal silhouette and remainder of the exam is normal. IMPRESSION: No active disease. Electronically Signed   By: Marin Olp M.D.   On: 02/20/2019 20:00    Patient Profile     58 y.o. male with no significant past medical history admitted with non-ST elevation myocardial infarction.  Assessment & Plan    1 non-ST elevation myocardial infarction-patient remains pain-free this morning.  Enzymes are abnormal.  Plan to proceed with cardiac catheterization tomorrow morning.  The risks and benefits including myocardial infarction, CVA and death discussed and he agrees to proceed.  Continue aspirin, heparin and statin.  Blood pressure and pulse will not allow beta-blocker.  Echocardiogram for LV function.  For questions or updates, please contact [place] Please consult [email or web address] for contact info under  Signed, Kirk Ruths, MD  02/21/2019, 7:43 AM

## 2019-02-22 ENCOUNTER — Encounter (HOSPITAL_COMMUNITY)
Admission: EM | Disposition: A | Discharge status: Home / Self Care | Payer: Self-pay | Source: Home / Self Care | Attending: Cardiovascular Disease

## 2019-02-22 DIAGNOSIS — E785 Hyperlipidemia, unspecified: Secondary | ICD-10-CM

## 2019-02-22 DIAGNOSIS — I2510 Atherosclerotic heart disease of native coronary artery without angina pectoris: Secondary | ICD-10-CM

## 2019-02-22 HISTORY — PX: LEFT HEART CATH AND CORONARY ANGIOGRAPHY: CATH118249

## 2019-02-22 HISTORY — PX: CORONARY STENT INTERVENTION: CATH118234

## 2019-02-22 LAB — CBC
HCT: 39.7 % (ref 39.0–52.0)
Hemoglobin: 12.9 g/dL — ABNORMAL LOW (ref 13.0–17.0)
MCH: 28 pg (ref 26.0–34.0)
MCHC: 32.5 g/dL (ref 30.0–36.0)
MCV: 86.3 fL (ref 80.0–100.0)
Platelets: 133 10*3/uL — ABNORMAL LOW (ref 150–400)
RBC: 4.6 MIL/uL (ref 4.22–5.81)
RDW: 13.2 % (ref 11.5–15.5)
WBC: 8.6 10*3/uL (ref 4.0–10.5)
nRBC: 0 % (ref 0.0–0.2)

## 2019-02-22 LAB — BASIC METABOLIC PANEL
Anion gap: 7 (ref 5–15)
BUN: 15 mg/dL (ref 6–20)
CO2: 26 mmol/L (ref 22–32)
Calcium: 8.4 mg/dL — ABNORMAL LOW (ref 8.9–10.3)
Chloride: 106 mmol/L (ref 98–111)
Creatinine, Ser: 1.07 mg/dL (ref 0.61–1.24)
GFR calc Af Amer: >60 mL/min (ref >60)
GFR calc non Af Amer: >60 mL/min (ref >60)
Glucose, Bld: 102 mg/dL — ABNORMAL HIGH (ref 70–99)
Potassium: 4.3 mmol/L (ref 3.5–5.1)
Sodium: 139 mmol/L (ref 135–145)

## 2019-02-22 LAB — POCT ACTIVATED CLOTTING TIME: Activated Clotting Time: 290 seconds

## 2019-02-22 LAB — HEPARIN LEVEL (UNFRACTIONATED): Heparin Unfractionated: 0.29 IU/mL — ABNORMAL LOW (ref 0.30–0.70)

## 2019-02-22 SURGERY — LEFT HEART CATH AND CORONARY ANGIOGRAPHY
Anesthesia: LOCAL

## 2019-02-22 MED ORDER — HEPARIN SODIUM (PORCINE) 1000 UNIT/ML IJ SOLN
INTRAMUSCULAR | Status: AC
  Filled 2019-02-22: qty 1

## 2019-02-22 MED ORDER — SODIUM CHLORIDE 0.9% FLUSH: 3.0000 mL | INTRAVENOUS | Status: DC | PRN

## 2019-02-22 MED ORDER — FENTANYL CITRATE (PF) 100 MCG/2ML IJ SOLN
INTRAMUSCULAR | Status: AC
  Filled 2019-02-22: qty 2

## 2019-02-22 MED ORDER — VERAPAMIL HCL 2.5 MG/ML IV SOLN
INTRAVENOUS | Status: DC | PRN
  Administered 2019-02-22: 10 mL via INTRA_ARTERIAL

## 2019-02-22 MED ORDER — ENOXAPARIN SODIUM 40 MG/0.4ML ~~LOC~~ SOLN
40.0000 mg | SUBCUTANEOUS | Status: DC
  Filled 2019-02-22: qty 0.4

## 2019-02-22 MED ORDER — MIDAZOLAM HCL 2 MG/2ML IJ SOLN
INTRAMUSCULAR | Status: DC | PRN
  Administered 2019-02-22: 1 mg via INTRAVENOUS

## 2019-02-22 MED ORDER — SODIUM CHLORIDE 0.9 % WEIGHT BASED INFUSION
1.0000 mL/kg/h | INTRAVENOUS | Status: AC
  Administered 2019-02-22: 13:00:00 1 mL/kg/h via INTRAVENOUS

## 2019-02-22 MED ORDER — SODIUM CHLORIDE 0.9 % IV SOLN: 250.0000 mL | INTRAVENOUS | Status: DC | PRN

## 2019-02-22 MED ORDER — HEPARIN (PORCINE) IN NACL 1000-0.9 UT/500ML-% IV SOLN
INTRAVENOUS | Status: AC
  Filled 2019-02-22: qty 1000

## 2019-02-22 MED ORDER — TICAGRELOR 90 MG PO TABS
ORAL_TABLET | ORAL | Status: AC
  Filled 2019-02-22: qty 1

## 2019-02-22 MED ORDER — HEPARIN SODIUM (PORCINE) 1000 UNIT/ML IJ SOLN
INTRAMUSCULAR | Status: DC | PRN
  Administered 2019-02-22 (×2): 4000 [IU] via INTRAVENOUS

## 2019-02-22 MED ORDER — TICAGRELOR 90 MG PO TABS
ORAL_TABLET | ORAL | Status: DC | PRN
  Administered 2019-02-22: 180 mg via ORAL

## 2019-02-22 MED ORDER — HEPARIN (PORCINE) IN NACL 1000-0.9 UT/500ML-% IV SOLN
INTRAVENOUS | Status: DC | PRN
  Administered 2019-02-22 (×2): 500 mL

## 2019-02-22 MED ORDER — FENTANYL CITRATE (PF) 100 MCG/2ML IJ SOLN
INTRAMUSCULAR | Status: DC | PRN
  Administered 2019-02-22: 25 ug via INTRAVENOUS

## 2019-02-22 MED ORDER — IOHEXOL 350 MG/ML SOLN
INTRAVENOUS | Status: DC | PRN
  Administered 2019-02-22: 130 mL via INTRAVENOUS

## 2019-02-22 MED ORDER — LIDOCAINE HCL (PF) 1 % IJ SOLN
INTRAMUSCULAR | Status: AC
  Filled 2019-02-22: qty 30

## 2019-02-22 MED ORDER — LIDOCAINE HCL (PF) 1 % IJ SOLN
INTRAMUSCULAR | Status: DC | PRN
  Administered 2019-02-22: 2 mL

## 2019-02-22 MED ORDER — MIDAZOLAM HCL 2 MG/2ML IJ SOLN
INTRAMUSCULAR | Status: AC
  Filled 2019-02-22: qty 2

## 2019-02-22 MED ORDER — VERAPAMIL HCL 2.5 MG/ML IV SOLN
INTRAVENOUS | Status: AC
  Filled 2019-02-22: qty 2

## 2019-02-22 MED ORDER — SODIUM CHLORIDE 0.9% FLUSH
3.0000 mL | Freq: Two times a day (BID) | INTRAVENOUS | Status: DC
  Administered 2019-02-22: 18:00:00 3 mL via INTRAVENOUS
  Administered 2019-02-23: 09:00:00 via INTRAVENOUS

## 2019-02-22 MED ORDER — TICAGRELOR 90 MG PO TABS
90.0000 mg | ORAL_TABLET | Freq: Two times a day (BID) | ORAL | Status: DC
  Administered 2019-02-22 – 2019-02-23 (×2): 90 mg via ORAL
  Filled 2019-02-22 (×2): qty 1

## 2019-02-22 SURGICAL SUPPLY — 18 items
BALLN SAPPHIRE 2.5X12 (BALLOONS) ×2
BALLN ~~LOC~~ EMERGE MR 3.0X20 (BALLOONS) ×2
BALLOON SAPPHIRE 2.5X12 (BALLOONS) ×1 IMPLANT
BALLOON ~~LOC~~ EMERGE MR 3.0X20 (BALLOONS) ×1 IMPLANT
CATH 5FR JL3.5 JR4 ANG PIG MP (CATHETERS) ×2 IMPLANT
CATHETER LAUNCHER 6FR RCB (CATHETERS) ×2 IMPLANT
COVER DOME SNAP 22 D (MISCELLANEOUS) ×2 IMPLANT
DEVICE RAD COMP TR BAND LRG (VASCULAR PRODUCTS) ×2 IMPLANT
GLIDESHEATH SLEND SS 6F .021 (SHEATH) ×2 IMPLANT
GUIDEWIRE INQWIRE 1.5J.035X260 (WIRE) ×1 IMPLANT
INQWIRE 1.5J .035X260CM (WIRE) ×2
KIT ENCORE 26 ADVANTAGE (KITS) ×2 IMPLANT
KIT HEART LEFT (KITS) ×2 IMPLANT
PACK CARDIAC CATHETERIZATION (CUSTOM PROCEDURE TRAY) ×2 IMPLANT
STENT SYNERGY DES 2.75X38 (Permanent Stent) ×2 IMPLANT
TRANSDUCER W/STOPCOCK (MISCELLANEOUS) ×2 IMPLANT
TUBING CIL FLEX 10 FLL-RA (TUBING) ×2 IMPLANT
WIRE ASAHI PROWATER 180CM (WIRE) ×2 IMPLANT

## 2019-02-22 NOTE — Interval H&P Note (Signed)
History and Physical Interval Note:  02/22/2019 7:18 AM  Alexander Le  has presented today for surgery, with the diagnosis of NSTEMI.  The various methods of treatment have been discussed with the patient and family. After consideration of risks, benefits and other options for treatment, the patient has consented to  Procedure(s): LEFT HEART CATH AND CORONARY ANGIOGRAPHY (N/A) as a surgical intervention.  The patient's history has been reviewed, patient examined, no change in status, stable for surgery.  I have reviewed the patient's chart and labs.  Questions were answered to the patient's satisfaction.   Cath Lab Visit (complete for each Cath Lab visit)  Clinical Evaluation Leading to the Procedure:   ACS: Yes.    Non-ACS:    Anginal Classification: CCS IV  Anti-ischemic medical therapy: No Therapy  Non-Invasive Test Results: No non-invasive testing performed  Prior CABG: No previous CABG        Collier Salina [facility] 02/22/2019 7:19 AM

## 2019-02-22 NOTE — Progress Notes (Signed)
Called wife Penny's cell phone to give pt's update, but her voice mail is not set up yet.  Idolina Primer, RN

## 2019-02-22 NOTE — TOC Benefit Eligibility Note (Signed)
Transition of Care [facility]) Benefit Eligibility Note    Patient Details  Name: Alexander Le MRN: [id number] Date of Birth: 06-23-1961   Medication/Dose: Kary Kos  90 MG BID  Covered?: Yes     Prescription Coverage Preferred Pharmacy: YES([facility])  Spoke with Person/Company/Phone Number:: LISA([facility] # [phone] [employer])  Co-Pay: $ 30.00  Prior Approval: No  Deductible: Unmet       Memory Argue Phone Number: 02/22/2019, 12:43 PM

## 2019-02-22 NOTE — Progress Notes (Signed)
Progress Note  Patient Name: Alexander Le Date of Encounter: 02/22/2019  Primary Cardiologist: Kirk Ruths, MD   Subjective   Underwent cardiac cath this morning.  Feels well; no CP or sob,  Inpatient Medications    Scheduled Meds: . aspirin EC  81 mg Oral Daily  . atorvastatin  80 mg Oral q1800  . [START ON 02/23/2019] enoxaparin (LOVENOX) injection  40 mg Subcutaneous Q24H  . sodium chloride flush  3 mL Intravenous Q12H  . ticagrelor  90 mg Oral BID   Continuous Infusions: . sodium chloride    . sodium chloride 1 mL/kg/hr (02/22/19 0854)   PRN Meds: sodium chloride, acetaminophen, ondansetron (ZOFRAN) IV, sodium chloride flush   Vital Signs    Vitals:   02/22/19 0837 02/22/19 0852 02/22/19 0938 02/22/19 0952  BP:  106/68 110/61 95/71  Pulse: (!) 0 (!) 58 67 63  Resp: (!) 0     Temp:      TempSrc:      SpO2: (!) 0% 99% 100% 100%  Weight:      Height:        Intake/Output Summary (Last 24 hours) at 02/22/2019 1043 Last data filed at 02/22/2019 0709 Gross per 24 hour  Intake 1151.71 ml  Output 400 ml  Net 751.71 ml   Last 3 Weights 02/22/2019 02/21/2019 02/20/2019  Weight (lbs) 173 lb 12.8 oz 174 lb 14.4 oz 172 lb  Weight (kg) 78.835 kg 79.334 kg 78.019 kg      Telemetry    Sinus - Personally Reviewed  ECG    SB at 57; T wave inversion in V3 - Personally Reviewed  Physical Exam   GEN: No acute distress.   Neck: No JVD Cardiac: RRR, no murmurs, rubs, or gallops.  Respiratory: Clear to auscultation bilaterally. GI: Soft, nontender, non-distended  R radial cath site stable MS: No edema; No deformity. Neuro:  Nonfocal  Psych: Normal affect   Labs    Chemistry Recent Labs  Lab 02/20/19 1906 02/21/19 0237 02/22/19 0250  NA 133* 135 139  K 3.8 4.4 4.3  CL 101 103 106  CO2 23 22 26   GLUCOSE 142* 114* 102*  BUN 15 16 15   CREATININE 0.93 1.04 1.07  CALCIUM 8.9 8.8* 8.4*  PROT 6.8  --   --   ALBUMIN 3.9  --   --   AST 45*  --   --   ALT 44  --    --   ALKPHOS 48  --   --   BILITOT 0.6  --   --   GFRNONAA >60 >60 >60  GFRAA >60 >60 >60  ANIONGAP 9 10 7      Hematology Recent Labs  Lab 02/20/19 1906 02/21/19 0757 02/22/19 0250  WBC 8.5 9.0 8.6  RBC 5.04 4.74 4.60  HGB 14.4 13.3 12.9*  HCT 42.9 40.2 39.7  MCV 85.1 84.8 86.3  MCH 28.6 28.1 28.0  MCHC 33.6 33.1 32.5  RDW 12.8 12.9 13.2  PLT 184 156 133*    Cardiac Enzymes Recent Labs  Lab 02/20/19 1906 02/21/19 0237  TROPONINI 0.17* 2.81*   No results for input(s): TROPIPOC in the last 168 hours.   BNPNo results for input(s): BNP, PROBNP in the last 168 hours.   DDimer  Recent Labs  Lab 02/20/19 2027  DDIMER 0.44     Radiology    Dg Chest Portable 1 View  Result Date: 02/20/2019 CLINICAL DATA:  Chest pain. EXAM: PORTABLE CHEST 1  VIEW COMPARISON:  None. FINDINGS: Lungs are adequately inflated and otherwise clear. Cardiomediastinal silhouette and remainder of the exam is normal. IMPRESSION: No active disease. Electronically Signed   By: Marin Olp M.D.   On: 02/20/2019 20:00    Cardiac Studies   TTE: 02/21/19  IMPRESSIONS    1. The left ventricle has normal systolic function, with an ejection fraction of 55-60%. The cavity size was normal. Left ventricular diastolic parameters were normal.  2. Mild hypokinesis of the left ventricular, basal-mid inferior wall.  3. The right ventricle has moderately reduced systolic function. The cavity was normal. There is mildly increased right ventricular wall thickness.  4. The mitral valve is abnormal. Mild thickening of the mitral valve leaflet.  5. The tricuspid valve is grossly normal.  6. The aortic valve is tricuspid. Mild calcification of the aortic valve.  7. The aortic root and ascending aorta are normal in size and structure.  8. The inferior vena cava was dilated in size with <50% respiratory variability.  9. Right atrial size was mildly dilated. 10. The interatrial septum was not assessed.  Cath:  02/22/19   Prox Cx lesion is 65% stenosed.  Ost 1st Mrg to 1st Mrg lesion is 50% stenosed.  Prox RCA to Mid RCA lesion is 100% stenosed.  The left ventricular systolic function is normal.  LV end diastolic pressure is normal.  The left ventricular ejection fraction is 55-65% by visual estimate.  Post intervention, there is a 0% residual stenosis.  A drug-eluting stent was successfully placed using a STENT SYNERGY DES [id number].   1. 2 vessel CAD.     - 65% proximal LCx, 50% segmental disease in a large OM1    - 100% mid RCA with left to right collaterals.  2. Normal LV function 3. Normal LVEDP 4. Successful PCI of the mid RCA with DES x 1  Plan: DAPT for one year. Anticipate DC in am. I would treat the residual disease in the LCx medically. If he has refractory angina PCI could be considered.   Patient Profile     58 y.o. male with no PMH who presented with chest pain and elevated troponin.   Assessment & Plan    1. NSTEMI: troponin 2.81. Underwent cardiac cath this morning noted above with successful PCI/DESx1 to 100% lesion in the mRCA, with residual disease of 65% in the Lcx. Placed on DAPT with ASA/Brilinta for at least one year. Echo with normal EF, hypokinesis in the inferior wall. Blood pressures have been low preventing the addition of BB at this time. Consider adding prior to discharge.  2. HL: LDL 142, now on high dose statin.   I called and discussed findings with wife and daughter over the phone this morning.  For questions or updates, please contact [place] Please consult [email or web address] for contact info under   Signed, Reino Bellis, NP  02/22/2019, 10:43 AM    The patient is back from the Cath Lab.  Currently feels well without recurrent chest pain.  He had developed new onset chest pain leading to his current admission on Saturday.  Troponins are mildly positive consistent with non-ST segment elevation MI.  Catheterization data was reviewed with the  patient.  Total RCA occlusion, successfully stented with a synergy DES 2.75 x 38 mm stent.  ECG personally reviewed by me post procedure shows sinus bradycardia at 57 bpm with T wave inversion in leads III and aVF concordant with his RCA occlusion.  And medical therapy for  concomitant CAD.  Present, heart rate and blood pressure preclude additional med initiation.  Now on aspirin/Brilinta.  Probable discharge tomorrow if stable

## 2019-02-22 NOTE — Discharge Summary (Addendum)
Discharge Summary    Patient ID: Alexander Le,  MRN: [id number], DOB/AGE: 57-10-1961 57 y.o.  Admit date: 02/20/2019 Discharge date: 02/23/2019  Primary Care Provider: System, Pcp Not In Primary Cardiologist: Dr. Stanford Breed  Discharge Diagnoses    Active Problems:   NSTEMI (non-ST elevated myocardial infarction) [facility])   Hyperlipidemia   Allergies No Known Allergies  Diagnostic Studies/Procedures    TTE: 02/21/2019   IMPRESSIONS    1. The left ventricle has normal systolic function, with an ejection fraction of 55-60%. The cavity size was normal. Left ventricular diastolic parameters were normal.  2. Mild hypokinesis of the left ventricular, basal-mid inferior wall.  3. The right ventricle has moderately reduced systolic function. The cavity was normal. There is mildly increased right ventricular wall thickness.  4. The mitral valve is abnormal. Mild thickening of the mitral valve leaflet.  5. The tricuspid valve is grossly normal.  6. The aortic valve is tricuspid. Mild calcification of the aortic valve.  7. The aortic root and ascending aorta are normal in size and structure.  8. The inferior vena cava was dilated in size with <50% respiratory variability.  9. Right atrial size was mildly dilated. 10. The interatrial septum was not assessed.  SUMMARY   LVEF 55-60%, possible mild basal to mid inferior wall hypokinesis, mild RVH with moderate RV systolic dysfunction, trivial MR, trivial TR, dilated IVC that does not collapse - findings suggest possible inferior/posterior ischemia with RV involvement.  Cath: 02/22/2019   Prox Cx lesion is 65% stenosed.  Ost 1st Mrg to 1st Mrg lesion is 50% stenosed.  Prox RCA to Mid RCA lesion is 100% stenosed.  The left ventricular systolic function is normal.  LV end diastolic pressure is normal.  The left ventricular ejection fraction is 55-65% by visual estimate.  Post intervention, there is a 0% residual stenosis.  A  drug-eluting stent was successfully placed using a STENT SYNERGY DES 2.75X38.   1. 2 vessel CAD.     - 65% proximal LCx, 50% segmental disease in a large OM1    - 100% mid RCA with left to right collaterals.  2. Normal LV function 3. Normal LVEDP 4. Successful PCI of the mid RCA with DES x 1  Plan: DAPT for one year. Anticipate DC in am. I would treat the residual disease in the LCx medically. If he has refractory angina PCI could be considered.  _____________   History of Present Illness      Alexander Le is a 58 y.o. male with no significant past medical history who presented for evaluation of chest pressure.  Patient did a couple of sets of push-ups the morning of admission.  He then went for a walk with his wife.  During the walk he developed midsternal chest pressure and difficulty breathing.  He was unable to complete the walk, and his wife had to go get the car and pick him up.  He then came to the emergency room for further evaluation.  EKG there showed sinus rhythm with minimal ST depression in the inferior leads.  Initial troponin came back 0.17.  Vital signs notable for mild bradycardia and mild hypotension.  Patient was unclear what his baseline blood pressure runs, but felt it was lower than normal as he has experienced some lightheadedness while in the ED.  He was given 324 mg aspirin and IV heparin has been ordered.  Had some chest pain intermittently a few times while in the ED.  None at the time of assessment for admission.  He denied tobacco use or history of hypertension or diabetes.  His mother had heart problems in her 19s, and he has a brother that had a heart attack in his 57s.  Hospital Course      Troponin peaked at 2.81. LDL noted at 146, and placed on high dose statin on admission. Underwent cardiac cath noted above with 2 vessel CAD, 100% RCA left to right collaterals with PCI/DESx1 and 65% lesion in the pLcx, and segmental disease in large OM1 of 50%.  Normal LV, and LVEDP. Placed on DAPT with ASA/Brilinta post cath with plans for at least one year. Felt if he has refractory angina could consider PCI of the Lcx in the future. Echo showed normal EF with hypokinesis in the inferior wall. Given his bradycardia and soft blood pressures, unable to add BB or ACEi/ARB therapy. No chest pain post cath. Ambulated without any complications. Cardiac rehab phase I ordered.   Alexander Le was seen by Dr. Claiborne Billings and determined stable for discharge home. Follow up in the office has been arranged. Medications are listed below.   _____________  Discharge Vitals Blood pressure 95/65, pulse 64, temperature 98.6 F (37 C), temperature source Oral, resp. rate 20, height 5\' 9"  (1.753 m), weight 77.7 kg, SpO2 97 %.  Filed Weights   02/21/19 0505 02/22/19 0551 02/23/19 0550  Weight: 79.3 kg 78.8 kg 77.7 kg    Labs & Radiologic Studies    CBC Recent Labs    02/22/19 0250 02/23/19 0437  WBC 8.6 7.8  HGB 12.9* 12.5*  HCT 39.7 37.9*  MCV 86.3 86.1  PLT 133* 854*   Basic Metabolic Panel Recent Labs    02/22/19 0250 02/23/19 0437  NA 139 140  K 4.3 4.0  CL 106 108  CO2 26 26  GLUCOSE 102* 94  BUN 15 12  CREATININE 1.07 1.06  CALCIUM 8.4* 8.6*   Liver Function Tests Recent Labs    02/20/19 1906  AST 45*  ALT 44  ALKPHOS 48  BILITOT 0.6  PROT 6.8  ALBUMIN 3.9   Recent Labs    02/20/19 1906  LIPASE 47   Cardiac Enzymes Recent Labs    02/20/19 1906 02/21/19 0237  TROPONINI 0.17* 2.81*   BNP Invalid input(s): POCBNP D-Dimer Recent Labs    02/20/19 2027  DDIMER 0.44   Hemoglobin A1C Recent Labs    02/21/19 0237  HGBA1C 4.9   Fasting Lipid Panel Recent Labs    02/21/19 0237  CHOL 206*  HDL 46  LDLCALC 142*  TRIG 89  CHOLHDL 4.5   Thyroid Function Tests No results for input(s): TSH, T4TOTAL, T3FREE, THYROIDAB in the last 72 hours.  Invalid input(s): FREET3 _____________  Dg Chest Portable 1 View  Result  Date: 02/20/2019 CLINICAL DATA:  Chest pain. EXAM: PORTABLE CHEST 1 VIEW COMPARISON:  None. FINDINGS: Lungs are adequately inflated and otherwise clear. Cardiomediastinal silhouette and remainder of the exam is normal. IMPRESSION: No active disease. Electronically Signed   By: Marin Olp M.D.   On: 02/20/2019 20:00   Disposition   Pt is being discharged home today in good condition.  Follow-up Plans & Appointments    Follow-up Information    Almyra Deforest, [place] Follow up on 03/03/2019.   Specialties:  Cardiology, Radiology Why:  at 11:30am for your follow up appt. This will be a tele-health evisit through your mobile phone.  Contact information: [address]  [place] [address] [phone]          Discharge Instructions    Amb Referral to Cardiac Rehabilitation   Complete by:  As directed    Diagnosis:   NSTEMI Coronary Stents     Call MD for:  redness, tenderness, or signs of infection (pain, swelling, redness, odor or green/yellow discharge around incision site)   Complete by:  As directed    Diet - low sodium heart healthy   Complete by:  As directed    Discharge instructions   Complete by:  As directed    Radial Site Care Refer to this sheet in the next few weeks. These instructions provide you with information on caring for yourself after your procedure. Your caregiver may also give you more specific instructions. Your treatment has been planned according to current medical practices, but problems sometimes occur. Call your caregiver if you have any problems or questions after your procedure. HOME CARE INSTRUCTIONS You may shower the day after the procedure.Remove the bandage (dressing) and gently wash the site with plain soap and water.Gently pat the site dry.  Do not apply powder or lotion to the site.  Do not submerge the affected site in water for 3 to 5 days.  Inspect the site at least twice daily.  Do not flex or bend the affected arm for 24 hours.   No lifting over 5 pounds (2.3 kg) for 5 days after your procedure.  Do not drive home if you are discharged the same day of the procedure. Have someone else drive you.  You may drive 24 hours after the procedure unless otherwise instructed by your caregiver.  What to expect: Any bruising will usually fade within 1 to 2 weeks.  Blood that collects in the tissue (hematoma) may be painful to the touch. It should usually decrease in size and tenderness within 1 to 2 weeks.  SEEK IMMEDIATE MEDICAL CARE IF: You have unusual pain at the radial site.  You have redness, warmth, swelling, or pain at the radial site.  You have drainage (other than a small amount of blood on the dressing).  You have chills.  You have a fever or persistent symptoms for more than 72 hours.  You have a fever and your symptoms suddenly get worse.  Your arm becomes pale, cool, tingly, or numb.  You have heavy bleeding from the site. Hold pressure on the site.   PLEASE DO NOT MISS ANY DOSES OF YOUR BRILINTA!!!!! Also keep a log of you blood pressures and bring back to your follow up appt. Please call the office with any questions.   Patients taking blood thinners should generally stay away from medicines like ibuprofen, Advil, Motrin, naproxen, and Aleve due to risk of stomach bleeding. You may take Tylenol as directed or talk to your primary doctor about alternatives.   Increase activity slowly   Complete by:  As directed        Discharge Medications     Medication List    TAKE these medications   aspirin EC 81 MG tablet Take 81 mg by mouth daily.   atorvastatin 80 MG tablet Commonly known as:  LIPITOR Take 1 tablet (80 mg total) by mouth daily at 6 PM.   escitalopram 10 MG tablet Commonly known as:  LEXAPRO Take 5 mg by mouth daily.   multivitamin with minerals Tabs tablet Take 1 tablet by mouth daily.   nitroGLYCERIN 0.4 MG SL tablet Commonly known as:  Scientist, clinical (histocompatibility and immunogenetics)  1 tablet (0.4 mg total) under  the tongue every 5 (five) minutes as needed.   ticagrelor 90 MG Tabs tablet Commonly known as:  BRILINTA Take 1 tablet (90 mg total) by mouth 2 (two) times daily.        Acute coronary syndrome (MI, NSTEMI, STEMI, etc) this admission?: Yes.     AHA/ACC Clinical Performance & Quality Measures: 1. Aspirin prescribed? - Yes 2. ADP Receptor Inhibitor (Plavix/Clopidogrel, Brilinta/Ticagrelor or Effient/Prasugrel) prescribed (includes medically managed patients)? - Yes 3. Beta Blocker prescribed? - No - bradycardia 4. High Intensity Statin (Lipitor 40-80mg  or Crestor 20-40mg ) prescribed? - Yes 5. EF assessed during THIS hospitalization? - Yes 6. For EF <40%, was ACEI/ARB prescribed? - Not Applicable (EF >/= 49%) 7. For EF <40%, Aldosterone Antagonist (Spironolactone or Eplerenone) prescribed? - Not Applicable (EF >/= 35%) 8. Cardiac Rehab Phase II ordered (Included Medically managed Patients)? - Yes     Outstanding Labs/Studies   FLP/LFTs in 6 weeks.   Duration of Discharge Encounter   Greater than 30 minutes including physician time.  Signed, Reino Bellis NP-C 02/23/2019, 10:21 AM    Patient seen and examined. Agree with assessment and plan.  Mr. Isabel Caprice remains pain-free following his PCI to his RCA yesterday.  He has ambulated without recurrent symptoms.  His exam is unremarkable.  Right radial cath site is stable.  He is now on atorvastatin for hyperlipidemia with target LDL less than 70.  At present, lowish blood pressure and bradycardia limits additional anti-ischemic medication.  Patient is stable for discharge today with plans for follow-up telemedicine visit in 1 week.  Also will need to set up for cardiac rehab.  Troy Sine, MD, [facility] 02/23/2019 10:23 AM

## 2019-02-22 NOTE — TOC Transition Note (Signed)
Transition of Care [facility]) - CM/SW Discharge Note   Patient Details  Name: Alexander Le MRN: [id number] Date of Birth: [birth year]  Transition of Care [facility]) CM/SW Contact:  Bethena Roys, RN Phone Number: 02/22/2019, 10:51 AM   Clinical Narrative: Pt presented for Chest Pain- Nstemi- post cath with stent. Pt plan for transition home with Brilinta. Benefits Check submitted and patient will be aware of cost before transition home.      Final next level of care: Home/Self Care Barriers to Discharge: No Barriers Identified   Patient Goals and CMS Choice   CMS Medicare.gov Compare Post Acute Care list provided to:: (N/A) Choice offered to / list presented to : NA   Discharge Plan and Services   Discharge Planning Services: CM Consult, Medication Assistance   Readmission Risk Interventions No flowsheet data found.

## 2019-02-23 ENCOUNTER — Encounter (HOSPITAL_COMMUNITY): Payer: Self-pay | Admitting: Cardiology

## 2019-02-23 DIAGNOSIS — E7800 Pure hypercholesterolemia, unspecified: Secondary | ICD-10-CM

## 2019-02-23 LAB — CBC
HCT: 37.9 % — ABNORMAL LOW (ref 39.0–52.0)
Hemoglobin: 12.5 g/dL — ABNORMAL LOW (ref 13.0–17.0)
MCH: 28.4 pg (ref 26.0–34.0)
MCHC: 33 g/dL (ref 30.0–36.0)
MCV: 86.1 fL (ref 80.0–100.0)
Platelets: 141 10*3/uL — ABNORMAL LOW (ref 150–400)
RBC: 4.4 MIL/uL (ref 4.22–5.81)
RDW: 13.1 % (ref 11.5–15.5)
WBC: 7.8 10*3/uL (ref 4.0–10.5)
nRBC: 0 % (ref 0.0–0.2)

## 2019-02-23 LAB — BASIC METABOLIC PANEL
Anion gap: 6 (ref 5–15)
BUN: 12 mg/dL (ref 6–20)
CO2: 26 mmol/L (ref 22–32)
Calcium: 8.6 mg/dL — ABNORMAL LOW (ref 8.9–10.3)
Chloride: 108 mmol/L (ref 98–111)
Creatinine, Ser: 1.06 mg/dL (ref 0.61–1.24)
GFR calc Af Amer: >60 mL/min (ref >60)
GFR calc non Af Amer: >60 mL/min (ref >60)
Glucose, Bld: 94 mg/dL (ref 70–99)
Potassium: 4 mmol/L (ref 3.5–5.1)
Sodium: 140 mmol/L (ref 135–145)

## 2019-02-23 MED ORDER — NITROGLYCERIN 0.4 MG SL SUBL: 0.4000 mg | SUBLINGUAL_TABLET | SUBLINGUAL | 2 refills | Status: DC | PRN

## 2019-02-23 MED ORDER — ATORVASTATIN CALCIUM 80 MG PO TABS: 80.0000 mg | ORAL_TABLET | Freq: Every day | ORAL | 2 refills | Status: DC

## 2019-02-23 MED ORDER — TICAGRELOR 90 MG PO TABS: 90.0000 mg | ORAL_TABLET | Freq: Two times a day (BID) | ORAL | 2 refills | Status: DC

## 2019-02-23 MED FILL — BRILINTA 90 MG TABLET: 90 | 30 days supply | Qty: 60 | Fill #0 | Status: TO

## 2019-02-23 MED FILL — ATORVASTATIN CALCIUM 80 MG: 80 | 90 days supply | Qty: 90 | Fill #0 | Status: TO

## 2019-02-23 MED FILL — NITROGLYCERIN 0.4 MG TAB SL: 0.4 | 8 days supply | Qty: 25 | Fill #0 | Status: TO

## 2019-02-23 NOTE — Discharge Instructions (Signed)
YOUR CARDIOLOGY TEAM HAS ARRANGED FOR AN E-VISIT FOR YOUR APPOINTMENT - PLEASE REVIEW IMPORTANT INFORMATION BELOW SEVERAL DAYS PRIOR TO YOUR APPOINTMENT  Due to the recent COVID-19 pandemic, we are transitioning in-person office visits to tele-medicine visits in an effort to decrease unnecessary exposure to our patients and staff. Medicare and most insurances are covering these visits without a copay needed. We also encourage you to sign up for MyChart if you have not already done so. You will need a smartphone if possible. For patients that do not have this, we can still complete the visit using a regular telephone but do prefer a smartphone to enable video when possible. You may have a close family member that lives with you that can help. If possible, we also ask that you have a blood pressure cuff and scale at home to measure your blood pressure, heart rate and weight prior to your scheduled appointment. Patients with clinical needs that need an in-person evaluation and testing will still be able to come to the office if absolutely necessary. If you have any questions, feel free to call our office.    IF YOU HAVE A SMARTPHONE, PLEASE DOWNLOAD THE WEBEX APP TO YOUR SMARTPHONE  - If Apple, go to [employer] and type in WebEx in the search bar. [place] [employer], the blue/green circle. The app is free but as with any other app download, your phone may require you to verify saved payment information or Apple password. You do NOT have to create a WebEx account.  - If Android, go to [employer] and type in [employer] in the search bar. [place] [employer], the blue/green circle. The app is free but as with any other app download, your phone may require you to verify saved payment information or Android password. You do NOT have to create a WebEx account.  It is very helpful to have this downloaded before your visit.    2-3 DAYS BEFORE YOUR APPOINTMENT  You will receive a  telephone call from one of our [place] team members - your caller ID may say "Unknown caller." If this is a video visit, we will confirm that you have been able to download the WebEx app. We will remind you check your blood pressure, heart rate and weight prior to your scheduled appointment. If you have an Apple Watch or Kardia, please upload any pertinent ECG strips the day before or morning of your appointment to [place]. Our staff will also make sure you have reviewed the consent and agree to move forward with your scheduled tele-health visit.     THE DAY OF YOUR APPOINTMENT  Approximately 15 minutes prior to your scheduled appointment, you will receive a telephone call from one of [place] team - your caller ID may say "Unknown caller."  Our staff will confirm medications, vital signs for the day and any symptoms you may be experiencing. Please have this information available prior to the time of visit start. It may also be helpful for you to have a pad of paper and pen handy for any instructions given during your visit. They will also walk you through joining the WebEx smartphone meeting if this is a video visit.    CONSENT FOR TELE-HEALTH VISIT - PLEASE REVIEW  I hereby voluntarily request, consent and authorize [facility] and its employed or contracted physicians, physician assistants, nurse practitioners or other licensed health care professionals (the Practitioner), to provide me with telemedicine health care services (the Services") as  deemed necessary by the treating Practitioner. I acknowledge and consent to receive the Services by the Practitioner via telemedicine. I understand that the telemedicine visit will involve communicating with the Practitioner through live audiovisual communication technology and the disclosure of certain medical information by electronic transmission. I acknowledge that I have been given the opportunity to request an in-person assessment or other available  alternative prior to the telemedicine visit and am voluntarily participating in the telemedicine visit.  I understand that I have the right to withhold or withdraw my consent to the use of telemedicine in the course of my care at any time, without affecting my right to future care or treatment, and that the Practitioner or I may terminate the telemedicine visit at any time. I understand that I have the right to inspect all information obtained and/or recorded in the course of the telemedicine visit and may receive copies of available information for a reasonable fee.  I understand that some of the potential risks of receiving the Services via telemedicine include:   Delay or interruption in medical evaluation due to technological equipment failure or disruption;  Information transmitted may not be sufficient (e.g. poor resolution of images) to allow for appropriate medical decision making by the Practitioner; and/or   In rare instances, security protocols could fail, causing a breach of personal health information.  Furthermore, I acknowledge that it is my responsibility to provide information about my medical history, conditions and care that is complete and accurate to the best of my ability. I acknowledge that Practitioner's advice, recommendations, and/or decision may be based on factors not within their control, such as incomplete or inaccurate data provided by me or distortions of diagnostic images or specimens that may result from electronic transmissions. I understand that the practice of medicine is not an exact science and that Practitioner makes no warranties or guarantees regarding treatment outcomes. I acknowledge that I will receive a copy of this consent concurrently upon execution via email to the email address I last provided but may also request a printed copy by calling the office of [place].    I understand that my insurance will be billed for this visit.   I have read or had  this consent read to me.  I understand the contents of this consent, which adequately explains the benefits and risks of the Services being provided via telemedicine.   I have been provided ample opportunity to ask questions regarding this consent and the Services and have had my questions answered to my satisfaction.  I give my informed consent for the services to be provided through the use of telemedicine in my medical care  By participating in this telemedicine visit I agree to the above.   Coronary Angiogram With Stent, Care After This sheet gives you information about how to care for yourself after your procedure. Your health care provider may also give you more specific instructions. If you have problems or questions, contact your health care provider. What can I expect after the procedure? After your procedure, it is common to have:  Bruising in the area where a small, thin tube (catheter) was inserted. This usually fades within 1-2 weeks.  Blood collecting in the tissue (hematoma) that may be painful to the touch. It should usually decrease in size and tenderness within 1-2 weeks. Follow these instructions at home: Insertion area care  Do not take baths, swim, or use a hot tub until your health care provider approves.  You may shower  24-48 hours after the procedure or as directed by your health care provider.  Follow instructions from your health care provider about how to take care of your incision. Make sure you: ? Wash your hands with soap and water before you change your bandage (dressing). If soap and water are not available, use hand sanitizer. ? Change your dressing as told by your health care provider. ? Leave stitches (sutures), skin glue, or adhesive strips in place. These skin closures may need to stay in place for 2 weeks or longer. If adhesive strip edges start to loosen and curl up, you may trim the loose edges. Do not remove adhesive strips completely unless your  health care provider tells you to do that.  Remove the bandage (dressing) and gently wash the catheter insertion site with plain soap and water.  Pat the area dry with a clean towel. Do not rub the area, because that may cause bleeding.  Do not apply powder or lotion to the incision area.  Check your incision area every day for signs of infection. Check for: ? More redness, swelling, or pain. ? More fluid or blood. ? Warmth. ? Pus or a bad smell. Activity  Do not drive for 24 hours if you were given a medicine to help you relax (sedative).  Do not lift anything that is heavier than 10 lb (4.5 kg) for 5 days after your procedure or as directed by your health care provider.  Ask your health care provider when it is okay for you: ? To return to work or school. ? To resume usual physical activities or sports. ? To resume sexual activity. Eating and drinking   Eat a heart-healthy diet. This should include plenty of fresh fruits and vegetables.  Avoid the following types of food: ? Food that is high in salt. ? Canned or highly processed food. ? Food that is high in saturated fat or sugar. ? [employer].  Limit alcohol intake to no more than 1 drink a day for non-pregnant women and 2 drinks a day for men. One drink equals 12 oz of beer, 5 oz of wine, or 1 oz of hard liquor. Lifestyle   Do not use any products that contain nicotine or tobacco, such as cigarettes and e-cigarettes. If you need help quitting, ask your health care provider.  Take steps to manage and control your weight.  Get regular exercise.  Manage your blood pressure.  Manage other health problems, such as diabetes. General instructions  Take over-the-counter and prescription medicines only as told by your health care provider. Blood thinners may be prescribed after your procedure to improve blood flow through the stent.  If you need an MRI after your heart stent has been placed, be sure to tell the health  care provider who orders the MRI that you have a heart stent.  Keep all follow-up visits as directed by your health care provider. This is important. Contact a health care provider if:  You have a fever.  You have chills.  You have increased bleeding from the catheter insertion area. Hold pressure on the area. Get help right away if:  You develop chest pain or shortness of breath.  You feel faint or you pass out.  You have unusual pain at the catheter insertion area.  You have redness, warmth, or swelling at the catheter insertion area.  You have drainage (other than a small amount of blood on the dressing) from the catheter insertion area.  The catheter  insertion area is bleeding, and the bleeding does not stop after 30 minutes of holding steady pressure on the area.  You develop bleeding from any other place, such as from your rectum. There may be bright red blood in your urine or stool, or it may appear as black, tarry stool. This information is not intended to replace advice given to you by your health care provider. Make sure you discuss any questions you have with your health care provider. Document Released: 05/24/2005 Document Revised: 08/01/2016 Document Reviewed: 08/01/2016 Elsevier Interactive Patient Education  [employer].

## 2019-02-23 NOTE — Progress Notes (Signed)
[phone] Pt educated by telephone as we are not doing face to face ed at this time due to COVID guidelines. Will give MI booklet and written materials to RN to give to pt. Discussed with pt the importance of brilinta with stent. Reviewed NTG use, MI restrictions, heart healthy food choices, ex ed and CRP 2. Pt is very active and has treadmill at home. Written ex guidelines given to resume walking. Discussed CRP 2 and put in referral for [place]. Pt voiced understanding of ed.  Graylon Good RN BSN 02/23/2019 9:26 AM

## 2019-03-02 ENCOUNTER — Telehealth: Payer: Self-pay | Admitting: Physician Assistant

## 2019-03-02 NOTE — Telephone Encounter (Signed)
Mychart, smartphone, Pre reg complete 03/02/19 AF

## 2019-03-03 ENCOUNTER — Telehealth: Payer: Self-pay | Admitting: Physician Assistant

## 2019-03-03 ENCOUNTER — Telehealth (INDEPENDENT_AMBULATORY_CARE_PROVIDER_SITE_OTHER): Payer: BC Managed Care – PPO | Admitting: Physician Assistant

## 2019-03-03 ENCOUNTER — Other Ambulatory Visit: Payer: Self-pay | Admitting: Physician Assistant

## 2019-03-03 DIAGNOSIS — I2510 Atherosclerotic heart disease of native coronary artery without angina pectoris: Secondary | ICD-10-CM | POA: Diagnosis not present

## 2019-03-03 DIAGNOSIS — E785 Hyperlipidemia, unspecified: Secondary | ICD-10-CM | POA: Diagnosis not present

## 2019-03-03 MED ORDER — ATORVASTATIN CALCIUM 80 MG PO TABS: 80.0000 mg | ORAL_TABLET | Freq: Every day | ORAL | 3 refills | Status: DC

## 2019-03-03 MED ORDER — NITROGLYCERIN 0.4 MG SL SUBL: 0.4000 mg | SUBLINGUAL_TABLET | SUBLINGUAL | 2 refills | Status: DC | PRN

## 2019-03-03 MED ORDER — TICAGRELOR 90 MG PO TABS: 90.0000 mg | ORAL_TABLET | Freq: Two times a day (BID) | ORAL | 3 refills | Status: DC

## 2019-03-03 NOTE — Progress Notes (Signed)
Virtual Visit via Video Note   This visit type was conducted due to national recommendations for restrictions regarding the COVID-19 Pandemic (e.g. social distancing) in an effort to limit this patient's exposure and mitigate transmission in our community.  Due to his co-morbid illnesses, this patient is at least at moderate risk for complications without adequate follow up.  This format is felt to be most appropriate for this patient at this time.  All issues noted in this document were discussed and addressed.  A limited physical exam was performed with this format.  Please refer to the patient's chart for his consent to telehealth for [facility].   Evaluation Performed:  Follow-up visit  Date:  03/05/2019   ID:  Alexander Le, DOB Jan 13, 1961, MRN [id number]  Patient Location: Home Provider Location: Home  PCP:  System, Pcp Not In  Cardiologist:  Kirk Ruths, MD  Electrophysiologist:  None   Chief Complaint:  Post hospital followup  History of Present Illness:    Alexander Le is a 58 y.o. male with recently diagnosed hyperlipidemia and CAD.  He presented to the hospital on 02/20/2019 with substernal chest pressure on the walk.  EKG showed sinus rhythm with ST depression in the inferior leads.  Troponin was elevated.  On 02/21/2019 showed EF 55 to 60%, mild hypokinesis of the left ventricular, basal mid inferior wall, moderately reduced RVEF.  He eventually underwent cardiac catheterization that revealed 65% proximal left circumflex lesion, 50% ostial OM1 lesion, 100% proximal to mid RCA occlusion treated with 2.75 x 38 mm Synergy DES.  Troponin peaked at 2.81.  Fasting lipid panel obtained in the hospital showed LDL of 142.  Hgb A1C was normal at 4.9.  Postprocedure, he was placed on aspirin and Brilinta.  Beta-blocker was not added due to soft blood pressure.  Since discharge, he has been doing very well without any chest pain or significant shortness of breath.  He denies any lower  extremity edema, orthopnea or PND.  Emphasis has been placed on compliance with dual antiplatelet therapy.  New prescription has been sent to his pharmacy.  He will need fasting lipid panel and LFT in 2 months.  Ideally a beta-blocker should be added, unfortunately he does not have any means to check his blood pressure at home.  I recommended him to obtain a blood pressure cuff later.  If systolic blood pressures greater than 120, I would recommend add a low-dose beta-blocker to his medical regimen.  The patient does not have symptoms concerning for COVID-19 infection (fever, chills, cough, or new shortness of breath).    Past Medical History:  Diagnosis Date  . Hyperlipidemia   . NSTEMI (non-ST elevated myocardial infarction) ([place])    02/21/19 PCI/DES to 100% mRCA, 65% in pLCX, normal EF   Past Surgical History:  Procedure Laterality Date  . CORONARY STENT INTERVENTION N/A 02/22/2019   Procedure: CORONARY STENT INTERVENTION;  Surgeon: [name], [name], MD;  Location: [place] [facility];  Service: Cardiovascular;  Laterality: N/A;  . KIDNEY SURGERY    . KNEE ARTHROSCOPY WITH ANTERIOR CRUCIATE LIGAMENT (ACL) REPAIR    . LEFT HEART CATH AND CORONARY ANGIOGRAPHY N/A 02/22/2019   Procedure: LEFT HEART CATH AND CORONARY ANGIOGRAPHY;  Surgeon: [name], [name], MD;  Location: [place] [facility];  Service: Cardiovascular;  Laterality: N/A;     Current Meds  Medication Sig  . aspirin EC 81 MG tablet Take 81 mg by mouth daily.  Marland Kitchen atorvastatin (LIPITOR) 80  MG tablet Take 1 tablet (80 mg total) by mouth daily at 6 PM.  . escitalopram (LEXAPRO) 10 MG tablet Take 5 mg by mouth daily.  . Multiple Vitamin (MULTIVITAMIN WITH MINERALS) TABS tablet Take 1 tablet by mouth daily.  . ticagrelor (BRILINTA) 90 MG TABS tablet Take 1 tablet (90 mg total) by mouth 2 (two) times daily.  . [DISCONTINUED] atorvastatin (LIPITOR) 80 MG tablet Take 1 tablet (80 mg total) by mouth daily at 6 PM.  . [DISCONTINUED] ticagrelor  (BRILINTA) 90 MG TABS tablet Take 1 tablet (90 mg total) by mouth 2 (two) times daily.     Allergies:   Patient has no known allergies.   Social History   Tobacco Use  . Smoking status: Never Smoker  . Smokeless tobacco: Never Used  Substance Use Topics  . Alcohol use: Yes    Alcohol/week: 3.0 standard drinks    Types: 3 Glasses of wine per week  . Drug use: Never     Family Hx: The patient's family history is not on file.  ROS:   Please see the history of present illness.     All other systems reviewed and are negative.   Prior CV studies:   The following studies were reviewed today:  Echo 02/21/2019 IMPRESSIONS    1. The left ventricle has normal systolic function, with an ejection fraction of 55-60%. The cavity size was normal. Left ventricular diastolic parameters were normal.  2. Mild hypokinesis of the left ventricular, basal-mid inferior wall.  3. The right ventricle has moderately reduced systolic function. The cavity was normal. There is mildly increased right ventricular wall thickness.  4. The mitral valve is abnormal. Mild thickening of the mitral valve leaflet.  5. The tricuspid valve is grossly normal.  6. The aortic valve is tricuspid. Mild calcification of the aortic valve.  7. The aortic root and ascending aorta are normal in size and structure.  8. The inferior vena cava was dilated in size with <50% respiratory variability.  9. Right atrial size was mildly dilated. 10. The interatrial septum was not assessed.   Cath 02/22/2019  Prox Cx lesion is 65% stenosed.  Ost 1st Mrg to 1st Mrg lesion is 50% stenosed.  Prox RCA to Mid RCA lesion is 100% stenosed.  The left ventricular systolic function is normal.  LV end diastolic pressure is normal.  The left ventricular ejection fraction is 55-65% by visual estimate.  Post intervention, there is a 0% residual stenosis.  A drug-eluting stent was successfully placed using a STENT SYNERGY DES 2.75X38.    1. 2 vessel CAD.     - 65% proximal LCx, 50% segmental disease in a large OM1    - 100% mid RCA with left to right collaterals.  2. Normal LV function 3. Normal LVEDP 4. Successful PCI of the mid RCA with DES x 1  Plan: DAPT for one year. Anticipate DC in am. I would treat the residual disease in the LCx medically. If he has refractory angina PCI could be considered.   Labs/Other Tests and Data Reviewed:    EKG:  An ECG dated 02/23/2019 was personally reviewed today and demonstrated:  Normal sinus rhythm with T wave inversion in the inferior leads.  Recent Labs: 02/20/2019: ALT 44 02/23/2019: BUN 12; Creatinine, Ser 1.06; Hemoglobin 12.5; Platelets 141; Potassium 4.0; Sodium 140   Recent Lipid Panel Lab Results  Component Value Date/Time   CHOL 206 (H) 02/21/2019 02:37 AM   TRIG 89 02/21/2019 02:37  AM   HDL 46 02/21/2019 02:37 AM   CHOLHDL 4.5 02/21/2019 02:37 AM   LDLCALC 142 (H) 02/21/2019 02:37 AM    Wt Readings from Last 3 Encounters:  02/23/19 171 lb 4.8 oz (77.7 kg)     Objective:    Vital Signs:  There were no vitals taken for this visit.   Well nourished, well developed male in no acute distress.   ASSESSMENT & PLAN:    1. CAD: Continue aspirin and Brilinta.  Denies any further chest discomfort since discharge.  Ideally he should be placed on beta-blocker, however he does not know his blood pressure at home therefore I am unable to initiate a beta-blocker therapy.  His blood pressure was too low prior to discharge to add a beta-blocker.  2. Hyperlipidemia: Continue Lipitor 80 mg daily.  Fasting lipid panel and LFT in 2 months.  COVID-19 Education: The signs and symptoms of COVID-19 were discussed with the patient and how to seek care for testing (follow up with PCP or arrange E-visit).  The importance of social distancing was discussed today.  Time:   Today, I have spent 15 minutes with the patient with telehealth technology discussing the above problems.      Medication Adjustments/Labs and Tests Ordered: Current medicines are reviewed at length with the patient today.  Concerns regarding medicines are outlined above.   Tests Ordered: Orders Placed This Encounter  Procedures  . Hepatic function panel  . Lipid panel    Medication Changes: Meds ordered this encounter  Medications  . ticagrelor (BRILINTA) 90 MG TABS tablet    Sig: Take 1 tablet (90 mg total) by mouth 2 (two) times daily.    Dispense:  180 tablet    Refill:  3  . nitroGLYCERIN (NITROSTAT) 0.4 MG SL tablet    Sig: Place 1 tablet (0.4 mg total) under the tongue every 5 (five) minutes as needed.    Dispense:  25 tablet    Refill:  2  . atorvastatin (LIPITOR) 80 MG tablet    Sig: Take 1 tablet (80 mg total) by mouth daily at 6 PM.    Dispense:  90 tablet    Refill:  3    Disposition:  Follow up in 2 month(s)  Signed, Almyra Deforest, PA  03/05/2019 11:14 PM    [place]

## 2019-03-03 NOTE — Telephone Encounter (Signed)
New Message   Patient has virtual appointment today @11 :30 and having issues accessing the link could someone please call him.

## 2019-03-03 NOTE — Patient Instructions (Addendum)
Medication Instructions:   Your physician recommends that you continue on your current medications as directed. Please refer to the Current Medication list given to you today.  If you need a refill on your cardiac medications before your next appointment, please call your pharmacy.   Lab work: You will need to come into our office to have labs (blood work) drawn in 2 months: Fasting Lipid Liver Function   If you have labs (blood work) drawn today and your tests are completely normal, you will receive your results only by: Marland Kitchen MyChart Message (if you have MyChart) OR . A paper copy in the mail If you have any lab test that is abnormal or we need to change your treatment, we will call you to review the results.  Testing/Procedures: NONE ordered at this time of appointment  Follow-Up: At [facility], you and your health needs are our priority.  As part of our continuing mission to provide you with exceptional heart care, we have created designated Provider Care Teams.  These Care Teams include your primary Cardiologist (physician) and Advanced Practice Providers (APPs -  Physician Assistants and Nurse Practitioners) who all work together to provide you with the care you need, when you need it. You will need a follow up appointment in 2-3 months.  Please call our office 2 months in advance to schedule this appointment.  You may see Kirk Ruths, MD or one of the following Advanced Practice Providers on your designated Care Team:   Kerin Ransom, PA-C Roby Lofts, [place] . Sande Rives, PA-C  Any Other Special Instructions Will Be Listed Below (If Applicable).

## 2019-04-02 ENCOUNTER — Telehealth (HOSPITAL_COMMUNITY): Payer: Self-pay | Admitting: *Deleted

## 2019-04-02 NOTE — Telephone Encounter (Signed)
[phone] Contacted patient via telephone regarding referral to the phase 2 cardiac rehab program. Informed pt that due to the COVID-19 restrictions, the cardiac rehab department is closed to patients at this time, but patient will be contacted for scheduling once the department reopens. Pt is currently walking outdoors 40-50 minutes daily. Pt states that prior to his MI, he was running on his treadmill at home and doing push-ups. Pt has not had any symptoms with walking, but states that he has had difficulty with lifting anything over 10 lbs. Pt states he can lift objects 10 lbs but has fatigue afterwards. Pt states that he does have hand weights at home that are 5 lbs or less, so pt will be sent a packet with light hand weight exercises that he can do at home to help build strength. Pt is amenable to e-mail communication and provided e-mail address for further follow-up.  Sol Passer, MS, ACSM CEP

## 2019-04-22 ENCOUNTER — Telehealth: Payer: Self-pay | Admitting: Cardiology

## 2019-04-22 NOTE — Telephone Encounter (Signed)
Would arrange PAOV to assess and check ECG Kirk Ruths

## 2019-04-22 NOTE — Telephone Encounter (Signed)
Spoke with Pt. He report last week whenever he tried to lift anything greater than 3 to 5 lbs, he would feel chest weakness, tightness in his neck, and develop a  headache. He report he never had any chest pain but just weakness. He state this week he hasn't had any issue, He report even did some yard work and and went biking. Pt state he would like to make MD aware and see if had any recommendations.

## 2019-04-22 NOTE — Telephone Encounter (Signed)
  Wife is calling because patient says when he lifts anything over 5lbs he feels chest weakness and tightness in his throat and he feels woozy. His BP right now is 113/68 P 65 at resting. Otherwise he feels fine. Should he stop lifting things or should he be trying to do more?

## 2019-04-23 NOTE — Telephone Encounter (Signed)
Spoke with pt, Follow up scheduled  

## 2019-04-26 ENCOUNTER — Encounter: Payer: Self-pay | Admitting: Medical

## 2019-04-26 ENCOUNTER — Other Ambulatory Visit: Payer: Self-pay

## 2019-04-26 ENCOUNTER — Ambulatory Visit: Payer: BC Managed Care – PPO | Admitting: Medical

## 2019-04-26 VITALS — BP 126/72 | HR 74 | Temp 97.5°F | Wt 170.4 lb

## 2019-04-26 DIAGNOSIS — E785 Hyperlipidemia, unspecified: Secondary | ICD-10-CM | POA: Diagnosis not present

## 2019-04-26 DIAGNOSIS — I2510 Atherosclerotic heart disease of native coronary artery without angina pectoris: Secondary | ICD-10-CM

## 2019-04-26 DIAGNOSIS — R079 Chest pain, unspecified: Secondary | ICD-10-CM | POA: Diagnosis not present

## 2019-04-26 DIAGNOSIS — R531 Weakness: Secondary | ICD-10-CM | POA: Diagnosis not present

## 2019-04-26 MED ORDER — METOPROLOL TARTRATE 25 MG PO TABS: 12.5000 mg | ORAL_TABLET | Freq: Two times a day (BID) | ORAL | 6 refills | Status: DC

## 2019-04-26 NOTE — Progress Notes (Signed)
Cardiology Office Note   Date:  04/26/2019   ID:  SANCHEZ HEMMER, DOB [birth year], MRN [id number]  PCP:  Haywood Pao, MD  Cardiologist:  Kirk Ruths, MD EP: None  Chief Complaint  Patient presents with  . Chest Pain      History of Present Illness: Alexander Le is a 58 y.o. male with PMH of CAD s/p recent NSTEMI with PCI/DES to RCA 02/2019, HLD, who presents for evaluation of "chest weakness".  Patient was last evaluated by cardiology during a telemedicine visit with Almyra Deforest, PA-C 03/03/2019. He was doing well from a cardiac standpoint at that time and recommended to obtain a BP cuff for close monitoring to determine if BBlocker could be added given recent NSTEMI. Echo in 02/21/2019 showed EF 55-60%, nomral LV diastolic parameters, mild hypokinesis of the LV, basal-mid inferior wall, moderately reduced RV systolic function - findings suggest possible inferior/posterior ischemia with RV involvement. Learned 02/22/2019 showed 100% p-mRCA stenosis managed with PCI/DES, 65% pLCx and 50% OM1 lesions were medically managed with consideration for PCI to LCx if refractory angina.   He returns to clinic today with complaints of chest weakness, tightness in his neck, and a HA which occurred when he would lift anything greater than 3-5lbs ~1.5 weeks ago. This has since resolved over the past week. He states the chest weakness sensation is very different from his angina prior to his NSTEMI presentation in April, which presented with chest pain when walking. He has been able to continue his usual 45 minute walk and occasional cycling without anginal complaints or the sensation of chest weakness/tightness. He denied any inciting incident that would have caused MSK pain in neck or chest. No complaints of palpitations, dizziness, lightheadedness, syncope, or LE edema. He has been monitoring his blood pressure at home with SBP persistently in the 120s; HR typically in the 70s.     Past Medical History:   Diagnosis Date  . Hyperlipidemia   . NSTEMI (non-ST elevated myocardial infarction) ([employer])    02/21/19 PCI/DES to 100% mRCA, 65% in pLCX, normal EF    Past Surgical History:  Procedure Laterality Date  . CORONARY STENT INTERVENTION N/A 02/22/2019   Procedure: CORONARY STENT INTERVENTION;  Surgeon: [name], [name], MD;  Location: [place] [facility];  Service: Cardiovascular;  Laterality: N/A;  . KIDNEY SURGERY    . KNEE ARTHROSCOPY WITH ANTERIOR CRUCIATE LIGAMENT (ACL) REPAIR    . LEFT HEART CATH AND CORONARY ANGIOGRAPHY N/A 02/22/2019   Procedure: LEFT HEART CATH AND CORONARY ANGIOGRAPHY;  Surgeon: [name], [name], MD;  Location: [place] [facility];  Service: Cardiovascular;  Laterality: N/A;     Current Outpatient Medications  Medication Sig Dispense Refill  . aspirin EC 81 MG tablet Take 81 mg by mouth daily.    Marland Kitchen atorvastatin (LIPITOR) 80 MG tablet Take 1 tablet (80 mg total) by mouth daily at 6 PM. 90 tablet 3  . escitalopram (LEXAPRO) 10 MG tablet Take 5 mg by mouth daily.    . Multiple Vitamin (MULTIVITAMIN WITH MINERALS) TABS tablet Take 1 tablet by mouth daily.    . ticagrelor (BRILINTA) 90 MG TABS tablet Take 1 tablet (90 mg total) by mouth 2 (two) times daily. 180 tablet 3  . metoprolol tartrate (LOPRESSOR) 25 MG tablet Take 0.5 tablets (12.5 mg total) by mouth 2 (two) times daily. 30 tablet 6  . nitroGLYCERIN (NITROSTAT) 0.4 MG SL tablet Place 1 tablet (0.4 mg total) under the tongue  every 5 (five) minutes as needed. (Patient not taking: Reported on 04/26/2019) 25 tablet 2   No current facility-administered medications for this visit.     Allergies:   Patient has no known allergies.    Social History:  The patient  reports that he has never smoked. He has never used smokeless tobacco. He reports current alcohol use of about 3.0 standard drinks of alcohol per week. He reports that he does not use drugs.   Family History:  The patient's family history includes Heart attack in his  brother.    ROS:  Please see the history of present illness.   Otherwise, review of systems are positive for none.   All other systems are reviewed and negative.    PHYSICAL EXAM: VS:  BP 126/72   Pulse 74   Temp (!) 97.5 F (36.4 C)   Wt 170 lb 6.4 oz (77.3 kg)   SpO2 97%   BMI 25.16 kg/m  , BMI Body mass index is 25.16 kg/m. GEN: Well nourished, well developed, in no acute distress HEENT: sclera anicteric; patient wearing a mask Neck: no JVD, carotid bruits, or masses Cardiac: RRR; no murmurs, rubs, or gallops, no edema  Respiratory:  clear to auscultation bilaterally, normal work of breathing GI: soft, nontender, nondistended, + BS MS: no deformity or atrophy Skin: warm and dry, no rash Neuro:  Strength and sensation are intact Psych: euthymic mood, full affect   EKG:  EKG is ordered today. The ekg ordered today demonstrates sinus rhythm with TWI in III and biphasic in aVF (both upright in previous 02/2019), no STE/D.   Recent Labs: 02/20/2019: ALT 44 02/23/2019: BUN 12; Creatinine, Ser 1.06; Hemoglobin 12.5; Platelets 141; Potassium 4.0; Sodium 140    Lipid Panel    Component Value Date/Time   CHOL 206 (H) 02/21/2019 0237   TRIG 89 02/21/2019 0237   HDL 46 02/21/2019 0237   CHOLHDL 4.5 02/21/2019 0237   VLDL 18 02/21/2019 0237   LDLCALC 142 (H) 02/21/2019 0237      Wt Readings from Last 3 Encounters:  04/26/19 170 lb 6.4 oz (77.3 kg)  02/23/19 171 lb 4.8 oz (77.7 kg)      Other studies Reviewed: Additional studies/ records that were reviewed today include:   Echocardiogram 02/2019: IMPRESSIONS    1. The left ventricle has normal systolic function, with an ejection fraction of 55-60%. The cavity size was normal. Left ventricular diastolic parameters were normal.  2. Mild hypokinesis of the left ventricular, basal-mid inferior wall.  3. The right ventricle has moderately reduced systolic function. The cavity was normal. There is mildly increased right  ventricular wall thickness.  4. The mitral valve is abnormal. Mild thickening of the mitral valve leaflet.  5. The tricuspid valve is grossly normal.  6. The aortic valve is tricuspid. Mild calcification of the aortic valve.  7. The aortic root and ascending aorta are normal in size and structure.  8. The inferior vena cava was dilated in size with <50% respiratory variability.  9. Right atrial size was mildly dilated. 10. The interatrial septum was not assessed.  SUMMARY   LVEF 55-60%, possible mild basal to mid inferior wall hypokinesis, mild RVH with moderate RV systolic dysfunction, trivial MR, trivial TR, dilated IVC that does not collapse - findings suggest possible inferior/posterior ischemia with RV involvement.  Left heart catheterization 02/2019:  Prox Cx lesion is 65% stenosed.  Ost 1st Mrg to 1st Mrg lesion is 50% stenosed.  Prox RCA  to Mid RCA lesion is 100% stenosed.  The left ventricular systolic function is normal.  LV end diastolic pressure is normal.  The left ventricular ejection fraction is 55-65% by visual estimate.  Post intervention, there is a 0% residual stenosis.  A drug-eluting stent was successfully placed using a STENT SYNERGY DES 2.75X38.   1. 2 vessel CAD.     - 65% proximal LCx, 50% segmental disease in a large OM1    - 100% mid RCA with left to right collaterals.  2. Normal LV function 3. Normal LVEDP 4. Successful PCI of the mid RCA with DES x 1  Plan: DAPT for one year. Anticipate DC in am. I would treat the residual disease in the LCx medically. If he has refractory angina PCI could be considered.     ASSESSMENT AND PLAN:  1. Atypical chest pain in patient with CAD s/p PCI/DES to RCA 02/2019: patient reports ~1.5 weeks ago he experienced chest weakness when lifting anything >3-5lbs with associated neck tightness and HA. This has since resolved. At no point did he experience chest pain/SOB with walking, jogging, and/or cycling which he  does daily. States it is different than prior angina. EKG today with isolate TWI in III with biphasic TW in aVL, otherwise non-ischemic. Echo at time of NSTEMI 02/2019 showed EF 55-60% and RWMA c/w ischemia. LHC with PCI/DES to 100% stenosed RCA with residual disease in LCx and OM1 medically managed. Symptoms sound more consistent with MSK pain rather than angina as they only occurred with lifting and not other physical activity. Favor medical management and watchful waiting at this time. - Continue aspirin and brilinta (patient reports compliance) - Continue statin - Will start low dose metoprolol 12.5mg  BID given stable SBP in the 120s, recent MI, and possible antianginal effects.  - If symptoms reoccur or he experiences angina similar to before his PCI/DES to RCA would have low threshold for repeat LHC and possible PCI to LCx.   2. HLD: LDL 142 02/2019 - Can repeat fasting lipid panel and LFTs at next follow-up visit   Current medicines are reviewed at length with the patient today.  The patient does not have concerns regarding medicines.  The following changes have been made:  Start metoprolol 12.5mg  BID  Labs/ tests ordered today include:   Orders Placed This Encounter  Procedures  . EKG 12-Lead     Disposition:   FU with Dr. Stanford Breed in 3 months  Signed, Abigail Butts, PA-C  04/26/2019 4:37 PM

## 2019-04-26 NOTE — Patient Instructions (Addendum)
Medication Instructions:  START METOPROLOL 12.5MG  (1/2 TAB OF THE 25MG ) If you need a refill on your cardiac medications before your next appointment, please call your pharmacy.  Special Instructions: CONTINUE TO LOG BP AND PULSE  Follow-Up: You will need a follow up appointment in 3 months ON 07-27-2019 @320PM .  You may see Kirk Ruths, MD or one of the following Advanced Practice Providers on your designated Care Team:   Kerin Ransom, PA-C Roby Lofts, [place] . Sande Rives, PA-C      At [facility], you and your health needs are our priority.  As part of our continuing mission to provide you with exceptional heart care, we have created designated Provider Care Teams.  These Care Teams include your primary Cardiologist (physician) and Advanced Practice Providers (APPs -  Physician Assistants and Nurse Practitioners) who all work together to provide you with the care you need, when you need it.  Thank you for choosing [facility] at [facility]!!

## 2019-04-27 ENCOUNTER — Telehealth: Payer: Self-pay | Admitting: Cardiology

## 2019-04-27 NOTE — Telephone Encounter (Signed)
Routed to medical records

## 2019-04-27 NOTE — Telephone Encounter (Signed)
Dr. Loren Racer office called and asked if a patient demographic sheet could be faxed over to their office. He is a new patient to their office, and the office needs records.  Please fax the office at [phone]

## 2019-04-30 ENCOUNTER — Encounter: Payer: Self-pay | Admitting: Family Medicine

## 2019-04-30 ENCOUNTER — Other Ambulatory Visit: Payer: Self-pay | Admitting: Family Medicine

## 2019-04-30 ENCOUNTER — Other Ambulatory Visit: Payer: Self-pay

## 2019-04-30 ENCOUNTER — Ambulatory Visit (INDEPENDENT_AMBULATORY_CARE_PROVIDER_SITE_OTHER): Payer: BC Managed Care – PPO | Admitting: Family Medicine

## 2019-04-30 DIAGNOSIS — E785 Hyperlipidemia, unspecified: Secondary | ICD-10-CM | POA: Diagnosis not present

## 2019-04-30 DIAGNOSIS — L309 Dermatitis, unspecified: Secondary | ICD-10-CM

## 2019-04-30 DIAGNOSIS — I2510 Atherosclerotic heart disease of native coronary artery without angina pectoris: Secondary | ICD-10-CM

## 2019-04-30 DIAGNOSIS — Z1211 Encounter for screening for malignant neoplasm of colon: Secondary | ICD-10-CM

## 2019-04-30 MED ORDER — PREDNISONE 20 MG PO TABS: ORAL_TABLET | ORAL | 0 refills | Status: DC

## 2019-04-30 MED ORDER — TRIAMCINOLONE ACETONIDE 0.1 % EX CREA: 1.0000 "application " | TOPICAL_CREAM | Freq: Two times a day (BID) | CUTANEOUS | 0 refills | Status: DC

## 2019-04-30 MED ORDER — DOXYCYCLINE HYCLATE 100 MG PO TABS: 100.0000 mg | ORAL_TABLET | Freq: Two times a day (BID) | ORAL | 0 refills | Status: AC

## 2019-04-30 NOTE — Progress Notes (Signed)
Virtual Visit via Video Note  I connected with Alexander Le   on 04/30/19 at  9:00 AM EDT by a video enabled telemedicine application and verified that I am speaking with the correct person using two identifiers.  Location patient: home Location provider:work office Persons participating in the virtual visit: patient, provider  I discussed the limitations of evaluation and management by telemedicine and the availability of in person appointments. The patient expressed understanding and agreed to proceed.   Alexander Le DOB: 1961/05/19 Encounter date: 04/30/2019  This is a 58 y.o. male who presents to establish care. Chief Complaint  Patient presents with  . Establish Care  . Rash    History of present illness: HL: on lipitor.  Lipid Panel     Component Value Date/Time   CHOL 206 (H) 02/21/2019 0237   TRIG 89 02/21/2019 0237   HDL 46 02/21/2019 0237   CHOLHDL 4.5 02/21/2019 0237   VLDL 18 02/21/2019 0237   LDLCALC 142 (H) 02/21/2019 0237    NSTEMI in April 2020: "[place] 02/22/2019 showed 100% p-mRCA stenosis managed with PCI/DES, 65% pLCx and 50% OM1 lesions medically managed with consideration for PCI to LCx if refractory angina" per cardio notes. On aspirin, brilinta, statin(lipitor 80mg ). Metoprolol 12.5mg  BID started. Had been doing running/push ups. Went for walk and had some mild chest pains. Wife had to come pick him up and went to ER for evaluation. 2 weeks ago had some chest/throat issues when picking up heavy mirror. Has felt well since then. He has returned to cycling. Plans to return to running soon. No problems with medications. Previously had just been on ASA. Hasn't yet started with metoprolol but will pick up this week.   Has "bite on arm" and has moved to chest and other arm. Noted it about a week ago - started with just small bite. Rash is not as itchy today. Also has rash on hip on side.   Started with bump and little black spot in center. Then had bumps on chest  and up arm and down other arm. Now rash on hip looks like "blotch" - 2.5 in similar to where bite was. In last few days started on the hip. Not painful. Hip does itch. Has tried cortisone cream which helps with itch. Has one bump on leg and one on inner part of leg. More splotch. Not shingles/has had this before. Has one lesion between finger and a few bumps on wrist -same arm that he has bite on.   Moved back from [place] a couple years ago. Hasn't had colonoscopy or established care since coming back. No major ongoing health issues (except new issues listed above)    Past Medical History:  Diagnosis Date  . Hyperlipidemia   . NSTEMI (non-ST elevated myocardial infarction) ([place])    02/21/19 PCI/DES to 100% mRCA, 65% in pLCX, normal EF   Past Surgical History:  Procedure Laterality Date  . CORONARY STENT INTERVENTION N/A 02/22/2019   Procedure: CORONARY STENT INTERVENTION;  Surgeon: [name], [name], MD;  Location: [facility];  Service: Cardiovascular;  Laterality: N/A;  . KIDNEY SURGERY     ureter surgery to help with drainage  . KNEE ARTHROSCOPY WITH ANTERIOR CRUCIATE LIGAMENT (ACL) REPAIR Left   . LEFT HEART CATH AND CORONARY ANGIOGRAPHY N/A 02/22/2019   Procedure: LEFT HEART CATH AND CORONARY ANGIOGRAPHY;  Surgeon: [name], [name], MD;  Location: [facility];  Service: Cardiovascular;  Laterality: N/A;  No Known Allergies No outpatient medications have been marked as taking for the 04/30/19 encounter (Office Visit) with Caren Macadam, MD.   Social History   Tobacco Use  . Smoking status: Never Smoker  . Smokeless tobacco: Never Used  Substance Use Topics  . Alcohol use: Yes    Alcohol/week: 2.0 standard drinks    Types: 2 Glasses of wine per week   Family History  Problem Relation Age of Onset  . Heart attack Brother        51s  . Heart attack Mother 58  . Other Father        didn't know father  . Dementia Maternal Grandmother   . Renal Disease  Brother   . Other Brother        unknown/?overdose     Review of Systems  Constitutional: Negative for chills, fatigue and fever.  Respiratory: Negative for cough, chest tightness, shortness of breath and wheezing.   Cardiovascular: Negative for chest pain, palpitations and leg swelling.  Skin: Positive for rash.    Objective:  There were no vitals taken for this visit.      BP Readings from Last 3 Encounters:  04/26/19 126/72  02/23/19 95/65   Wt Readings from Last 3 Encounters:  04/26/19 170 lb 6.4 oz (77.3 kg)  02/23/19 171 lb 4.8 oz (77.7 kg)    EXAM:  GENERAL: alert, oriented, appears well and in no acute distress  HEENT: atraumatic, conjunctiva clear, no obvious abnormalities on inspection of external nose and ears  NECK: normal movements of the head and neck  LUNGS: on inspection no signs of respiratory distress, breathing rate appears normal, no obvious gross SOB, gasping or wheezing  CV: no obvious cyanosis  MS: moves all visible extremities without noticeable abnormality  PSYCH/NEURO: pleasant and cooperative, no obvious depression or anxiety, speech and thought processing grossly intact  SKIN: video connection was somewhat spotty during visit - was able to see aprox 3cm erythematous lesion left arm with slight hyperpigmentation; surrounding by erythematous papular rash that almost has prevesicular appearance. This is somewhat linear up to chest. Over chest there is erythematous maculopapular rash - appears over chest to almost be left sided chest along langerhans lines. Left hip has erythematous macule with irregular border. NO lesions face, neck. One resolving erythematous papule on back - difficult to see as feed cut out.   Assessment/Plan  1. Screening for colon cancer Discussed screening options. Esp during this time of COVID will start with cologuard so this can be completed.  - Cologuard  2. Dermatitis Uncertain etiology (but does not appear to be  med reaction) Exam difficult via virtual visit. Concerned for cellulitis given erythema. Also appears to be immue response with inflammation. Has improved some with benadryl at home. Will give short prednisone burst and check in Monday with him to see how progressing. If worsening, call/be seen. If no improvement Monday will need in person visit.  - doxycycline (VIBRA-TABS) 100 MG tablet; Take 1 tablet (100 mg total) by mouth 2 (two) times daily for 7 days.  Dispense: 14 tablet; Refill: 0 - triamcinolone cream (KENALOG) 0.1 %; Apply 1 application topically 2 (two) times daily.  Dispense: 30 g; Refill: 0 - predniSONE (DELTASONE) 20 MG tablet; Take 3 tabs x 2 days, 2 tabs x 3 days, 1 tab x 3 days, and 1/2 tab x 2 days.  Dispense: 16 tablet; Refill: 0  3. Hyperlipidemia, unspecified hyperlipidemia type On lipitor per cardiology. Labwork ordered to  recheck in July.   4. Coronary artery disease involving native coronary artery of native heart without angina pectoris Following with cardiology. Sx free currently.  Return pending update on rash monday.     I discussed the assessment and treatment plan with the patient. The patient was provided an opportunity to ask questions and all were answered. The patient agreed with the plan and demonstrated an understanding of the instructions.   The patient was advised to call back or seek an in-person evaluation if the symptoms worsen or if the condition fails to improve as anticipated.  I provided 30 minutes of non-face-to-face time during this encounter.   Micheline Rough, MD

## 2019-05-03 ENCOUNTER — Telehealth: Payer: Self-pay | Admitting: *Deleted

## 2019-05-03 NOTE — Telephone Encounter (Signed)
Unable to leave a message due to no voicemail being set up. °

## 2019-05-03 NOTE — Telephone Encounter (Signed)
-----   Message from Caren Macadam, MD sent at 04/30/2019 10:05 AM EDT ----- Please check in with him on Monday and see how rash is doing? If no improvement then he needs in office visit for evaluation. Also, fyi I ordered cologuard today.

## 2019-05-04 NOTE — Telephone Encounter (Signed)
Unable to leave a message due to no voicemail being set up. °

## 2019-05-07 ENCOUNTER — Telehealth: Payer: Self-pay | Admitting: *Deleted

## 2019-05-07 NOTE — Telephone Encounter (Signed)
I called the pt and he stated the rash is improving and he does not need refills on the medications that were given.  He is aware the Cologuard was ordered also.

## 2019-05-07 NOTE — Telephone Encounter (Signed)
-----   Message from Caren Macadam, MD sent at 04/30/2019 10:05 AM EDT ----- Please check in with him on Monday and see how rash is doing? If no improvement then he needs in office visit for evaluation. Also, fyi I ordered cologuard today.

## 2019-05-10 ENCOUNTER — Telehealth (HOSPITAL_COMMUNITY): Payer: Self-pay

## 2019-05-10 ENCOUNTER — Encounter (HOSPITAL_COMMUNITY): Payer: Self-pay

## 2019-05-10 NOTE — Telephone Encounter (Signed)
Attempted to contact pt in regards to our Virtual Cardiac Rehab, unable to leave voicemail, bc pt does not have one set up.  Please contact letter sent to address on file in epic. Await response, if no response by May 24, 2019 will discharge pt from the program.

## 2019-05-21 MED ORDER — CARVEDILOL 3.125 MG PO TABS: 3.1250 mg | ORAL_TABLET | Freq: Two times a day (BID) | ORAL | 3 refills | Status: DC

## 2019-06-02 ENCOUNTER — Ambulatory Visit (INDEPENDENT_AMBULATORY_CARE_PROVIDER_SITE_OTHER): Payer: BC Managed Care – PPO

## 2019-06-02 ENCOUNTER — Encounter: Payer: Self-pay | Admitting: Family Medicine

## 2019-06-02 ENCOUNTER — Ambulatory Visit: Payer: Self-pay | Admitting: *Deleted

## 2019-06-02 ENCOUNTER — Other Ambulatory Visit: Payer: Self-pay

## 2019-06-02 ENCOUNTER — Ambulatory Visit: Payer: BC Managed Care – PPO | Admitting: Family Medicine

## 2019-06-02 VITALS — BP 108/64 | HR 69 | Temp 97.9°F | Ht 68.25 in | Wt 171.4 lb

## 2019-06-02 DIAGNOSIS — M25572 Pain in left ankle and joints of left foot: Secondary | ICD-10-CM

## 2019-06-02 DIAGNOSIS — S9032XA Contusion of left foot, initial encounter: Secondary | ICD-10-CM

## 2019-06-02 DIAGNOSIS — M25472 Effusion, left ankle: Secondary | ICD-10-CM | POA: Diagnosis not present

## 2019-06-02 NOTE — Telephone Encounter (Signed)
Patient has an appointment with Dr. Elease Hashimoto at 1:15 PM

## 2019-06-02 NOTE — Progress Notes (Signed)
Subjective:     Patient ID: Alexander Le, male   DOB: [birth year], 58 y.o.   MRN: [id number]  HPI Patient is seen with approximately 1-1/2-week history of left leg and ankle edema and bruising and some soreness medial left ankle with ambulation.  He denies any injury.  His recent history is that he had non-ST elevation MI back in April and had stent of RCA.  He is done well from a cardiac standpoint since then.  His current swelling started about a week and half ago.  He then noticed some bruising mostly on the medial aspect of the left foot and ankle.  No injury whatsoever.  No calf pain.  No dyspnea.  No chest pain.  Positive family history of CAD but no known coagulopathy issues.  He remains on aspirin and Brilinta  Past Medical History:  Diagnosis Date  . Hyperlipidemia   . NSTEMI (non-ST elevated myocardial infarction) ([place])    02/21/19 PCI/DES to 100% mRCA, 65% in pLCX, normal EF   Past Surgical History:  Procedure Laterality Date  . CORONARY STENT INTERVENTION N/A 02/22/2019   Procedure: CORONARY STENT INTERVENTION;  Surgeon: [name], [name], MD;  Location: [facility];  Service: Cardiovascular;  Laterality: N/A;  . KIDNEY SURGERY     ureter surgery to help with drainage  . KNEE ARTHROSCOPY WITH ANTERIOR CRUCIATE LIGAMENT (ACL) REPAIR Left   . LEFT HEART CATH AND CORONARY ANGIOGRAPHY N/A 02/22/2019   Procedure: LEFT HEART CATH AND CORONARY ANGIOGRAPHY;  Surgeon: [name], [name], MD;  Location: [facility];  Service: Cardiovascular;  Laterality: N/A;    reports that he has never smoked. He has never used smokeless tobacco. He reports current alcohol use of about 2.0 standard drinks of alcohol per week. He reports that he does not use drugs. family history includes Dementia in his maternal grandmother; Heart attack in his brother; Heart attack (age of onset: 16) in his mother; Other in his brother and father; Renal Disease in his brother. No Known Allergies   Review of  Systems  Constitutional: Negative for chills and fever.  Respiratory: Negative for chest tightness, shortness of breath and wheezing.   Cardiovascular: Positive for leg swelling. Negative for chest pain and palpitations.  Neurological: Negative for weakness.       Objective:   Physical Exam Constitutional:      Appearance: Normal appearance.  Cardiovascular:     Rate and Rhythm: Normal rate and regular rhythm.  Pulmonary:     Effort: Pulmonary effort is normal.     Breath sounds: Normal breath sounds.  Musculoskeletal:     Comments: He has swelling of the left foot ankle and lower leg region.  He has bruising medially involving the medial aspect of the ankle and inferior involving the lower foot region and extending somewhat proximally lower calf region.  Achilles tendon is nontender and fully intact.  He does have some very mild tenderness just proximal to the medial malleolus and just posterior.   no talar tenderness  No calf tenderness  Neurological:     Mental Status: He is alert.        Assessment:     Patient presents with a little over 1 week history of some mild pain, swelling, and ecchymosis left lower extremity in absence of any clear injury.  He did recently resume running on treadmill.  There is no evidence for Achilles rupture.  He has some tenderness in the region of the posterior  tibial tendon.  Full range of motion ankle X-rays reveal no acute bony abnormality and these will be over read.  Clinically, suspect soft tissue injury.  We explained that clinical presentation would not likely be compatible with acute DVT with his bruising and posterior tibial soreness    Plan:     -Recommend ice 2-3 times daily, compression, elevation  -He will look at alternative exercise such as cycling for now  -Touch base if not improving over the next week.  Consider sports medicine referral for any persistent or worsening symptoms  Eulas Post MD [facility] at  [facility]

## 2019-06-02 NOTE — Patient Instructions (Signed)
Recommend ice 15-20 minutes 2-3 times daily  Frequent foot elevation  Consider compression wrap   Let me know if not improving next 1-2 weeks  Can continue with cycling.

## 2019-06-02 NOTE — Telephone Encounter (Signed)
I returned pt's call.   He is c/o his left inner ankle below the ankle bone being swollen and bruised with the swelling going into his calf and it's painful.   No injury.   He had a stent inserted in his heart 2 months ago so he is concerned about a blood clot.  He is on Asprin   I warm transferred the call to Dr. Berenice Bouton office at [facility].    I sent my notes to the office.  Reason for Disposition . [1] Calf swelling AND [2] only 1 side  Answer Assessment - Initial Assessment Questions 1. ONSET: "When did the pain start?"      A week ago   Don't remember an injury 2. LOCATION: "Where is the pain located?"      Below left ankle on side of foot is bruised.   Bruise inside of left foot below ankle. 3. PAIN: "How bad is the pain?"    (Scale 1-10; or mild, moderate, severe)  - MILD (1-3): doesn't interfere with normal activities   - MODERATE (4-7): interferes with normal activities (e.g., work or school) or awakens from sleep, limping   - SEVERE (8-10): excruciating pain, unable to do any normal activities, unable to walk      It's very sore in the morning.    I can walk fine.   It's been there a week and my left leg is swollen in ankle and in calf area.   It feels warmer than my other leg. 4. WORK OR EXERCISE: "Has there been any recent work or exercise that involved this part of the body?"      No 5. CAUSE: "What do you think is causing the ankle pain?"     Blood clot possibly.  I had a stent done 2 months ago. 6. OTHER SYMPTOMS: "Do you have any other symptoms?" (e.g., calf pain, rash, fever, swelling)     No redness or swelling. 7. PREGNANCY: "Is there any chance you are pregnant?" "When was your last menstrual period?"     N/A  Protocols used: ANKLE PAIN-A-AH

## 2019-06-10 NOTE — Telephone Encounter (Signed)
Sorry I am sending to you not familiar with your patients is this something that need follow up with patient for refill?

## 2019-06-14 NOTE — Telephone Encounter (Signed)
Unable to leave a message due to no voicemail-will attempt to call back later.

## 2019-06-15 ENCOUNTER — Telehealth: Payer: BC Managed Care – PPO | Admitting: Cardiology

## 2019-06-18 ENCOUNTER — Other Ambulatory Visit: Payer: Self-pay | Admitting: Medical

## 2019-06-18 DIAGNOSIS — I214 Non-ST elevation (NSTEMI) myocardial infarction: Secondary | ICD-10-CM

## 2019-06-18 MED ORDER — NEBIVOLOL HCL 2.5 MG PO TABS: 2.5000 mg | ORAL_TABLET | Freq: Every day | ORAL | 3 refills | Status: DC

## 2019-06-25 NOTE — Telephone Encounter (Signed)
Unable to leave a message due to no voicemail

## 2019-06-25 NOTE — Telephone Encounter (Signed)
Can you please try to check in with him again about this? See how he is doing at this point?

## 2019-07-25 NOTE — Progress Notes (Signed)
HPI: Follow-up coronary artery disease. Admitted April 2020 and ruled in for non-ST elevation myocardial infarction.  Cardiac catheterization revealed a 65% circumflex, 50% first marginal, occluded right coronary artery and normal LV function.  Patient had PCI of RCA with drug-eluting stent.  Echocardiogram April 2020 showed ejection fraction 55 to 60%, moderate RV dysfunction, mild right atrial enlargement.  Since last seen the patient denies any dyspnea on exertion, orthopnea, PND, pedal edema, palpitations, syncope or chest pain.   Current Outpatient Medications  Medication Sig Dispense Refill  . aspirin EC 81 MG tablet Take 81 mg by mouth daily.    Marland Kitchen atorvastatin (LIPITOR) 80 MG tablet Take 1 tablet (80 mg total) by mouth daily at 6 PM. 90 tablet 3  . escitalopram (LEXAPRO) 10 MG tablet Take 5 mg by mouth daily.    . Multiple Vitamin (MULTIVITAMIN WITH MINERALS) TABS tablet Take 1 tablet by mouth daily.    . nebivolol (BYSTOLIC) 2.5 MG tablet Take 1 tablet (2.5 mg total) by mouth daily. 30 tablet 3  . ticagrelor (BRILINTA) 90 MG TABS tablet Take 1 tablet (90 mg total) by mouth 2 (two) times daily. 180 tablet 3  . triamcinolone cream (KENALOG) 0.1 % Apply 1 application topically 2 (two) times daily. 30 g 0  . nitroGLYCERIN (NITROSTAT) 0.4 MG SL tablet Place 1 tablet (0.4 mg total) under the tongue every 5 (five) minutes as needed. (Patient not taking: Reported on 04/26/2019) 25 tablet 2  . predniSONE (DELTASONE) 20 MG tablet Take 3 tabs x 2 days, 2 tabs x 3 days, 1 tab x 3 days, and 1/2 tab x 2 days. (Patient not taking: Reported on 07/27/2019) 16 tablet 0   No current facility-administered medications for this visit.      Past Medical History:  Diagnosis Date  . Hyperlipidemia   . NSTEMI (non-ST elevated myocardial infarction) ([employer])    02/21/19 PCI/DES to 100% mRCA, 65% in pLCX, normal EF    Past Surgical History:  Procedure Laterality Date  . CORONARY STENT INTERVENTION N/A  02/22/2019   Procedure: CORONARY STENT INTERVENTION;  Surgeon: [name], [name], MD;  Location: [facility];  Service: Cardiovascular;  Laterality: N/A;  . KIDNEY SURGERY     ureter surgery to help with drainage  . KNEE ARTHROSCOPY WITH ANTERIOR CRUCIATE LIGAMENT (ACL) REPAIR Left   . LEFT HEART CATH AND CORONARY ANGIOGRAPHY N/A 02/22/2019   Procedure: LEFT HEART CATH AND CORONARY ANGIOGRAPHY;  Surgeon: [name], [name], MD;  Location: [facility];  Service: Cardiovascular;  Laterality: N/A;    Social History   Socioeconomic History  . Marital status: Married    Spouse name: Not on file  . Number of children: Not on file  . Years of education: Not on file  . Highest education level: Not on file  Occupational History  . Not on file  Social Needs  . Financial resource strain: Not on file  . Food insecurity    Worry: Not on file    Inability: Not on file  . Transportation needs    Medical: Not on file    Non-medical: Not on file  Tobacco Use  . Smoking status: Never Smoker  . Smokeless tobacco: Never Used  Substance and Sexual Activity  . Alcohol use: Yes    Alcohol/week: 2.0 standard drinks    Types: 2 Glasses of wine per week  . Drug use: Never  . Sexual activity: Not on file  Lifestyle  .  Physical activity    Days per week: Not on file    Minutes per session: Not on file  . Stress: Not on file  Relationships  . Social Herbalist on phone: Not on file    Gets together: Not on file    Attends religious service: Not on file    Active member of club or organization: Not on file    Attends meetings of clubs or organizations: Not on file    Relationship status: Not on file  . Intimate partner violence    Fear of current or ex partner: Not on file    Emotionally abused: Not on file    Physically abused: Not on file    Forced sexual activity: Not on file  Other Topics Concern  . Not on file  Social History Narrative  . Not on file    Family  History  Problem Relation Age of Onset  . Heart attack Brother        60s  . Heart attack Mother 37  . Other Father        didn't know father  . Dementia Maternal Grandmother   . Renal Disease Brother   . Other Brother        unknown/?overdose    ROS: no fevers or chills, productive cough, hemoptysis, dysphasia, odynophagia, melena, hematochezia, dysuria, hematuria, rash, seizure activity, orthopnea, PND, pedal edema, claudication. Remaining systems are negative.  Physical Exam: Well-developed well-nourished in no acute distress.  Skin is warm and dry.  HEENT is normal.  Neck is supple.  Chest is clear to auscultation with normal expansion.  Cardiovascular exam is regular rate and rhythm.  Abdominal exam nontender or distended. No masses palpated. Extremities show no edema. neuro grossly intact  A/P  1 coronary artery disease-patient has not had recurrent chest pain.  Continue aspirin, Brilinta and statin.  2 hyperlipidemia-continue Lipitor at present dose.  Check lipids and liver.  Kirk Ruths, MD

## 2019-07-27 ENCOUNTER — Other Ambulatory Visit: Payer: Self-pay

## 2019-07-27 ENCOUNTER — Encounter: Payer: Self-pay | Admitting: Cardiology

## 2019-07-27 ENCOUNTER — Ambulatory Visit: Payer: BC Managed Care – PPO | Admitting: Cardiology

## 2019-07-27 VITALS — BP 108/80 | HR 75 | Temp 97.1°F | Ht 69.0 in | Wt 173.6 lb

## 2019-07-27 DIAGNOSIS — I2510 Atherosclerotic heart disease of native coronary artery without angina pectoris: Secondary | ICD-10-CM | POA: Diagnosis not present

## 2019-07-27 DIAGNOSIS — E785 Hyperlipidemia, unspecified: Secondary | ICD-10-CM | POA: Diagnosis not present

## 2019-07-27 NOTE — Patient Instructions (Signed)
Medication Instructions:  The current medical regimen is effective;  continue present plan and medications.  If you need a refill on your cardiac medications before your next appointment, please call your pharmacy.   Lab work: LIPID and LIVER  Attached are the lab orders that are needed before your upcoming appointment, please come in  anytime to have your labs drawn.   They are fasting labs, so nothing to eat or drink after midnight.  Lab hours: 8:00-4:00 lunch hours 12:45-1:45  If you have labs (blood work) drawn today and your tests are completely normal, you will receive your results only by: Marland Kitchen MyChart Message (if you have MyChart) OR . A paper copy in the mail If you have any lab test that is abnormal or we need to change your treatment, we will call you to review the results.   Follow-Up: At [facility] you and your health needs are our priority.  As part of our continuing mission to provide you with exceptional heart care, we have created designated Provider Care Teams.  These Care Teams include your primary Cardiologist (physician) and Advanced Practice Providers (APPs -  Physician Assistants and Nurse Practitioners) who all work together to provide you with the care you need, when you need it. You will need a follow up appointment in 6 months.  Please call our office 2 months in advance to schedule this appointment.  You may see Kirk Ruths, MD or one of the following Advanced Practice Providers on your designated Care Team:   Kerin Ransom, PA-C Roby Lofts, [place] . Sande Rives, PA-C

## 2019-07-30 ENCOUNTER — Other Ambulatory Visit: Payer: Self-pay | Admitting: Family Medicine

## 2019-07-30 MED ORDER — ESCITALOPRAM OXALATE 10 MG PO TABS: 5.0000 mg | ORAL_TABLET | Freq: Every day | ORAL | 1 refills | Status: DC

## 2019-08-13 ENCOUNTER — Other Ambulatory Visit: Payer: Self-pay

## 2019-08-13 ENCOUNTER — Telehealth (INDEPENDENT_AMBULATORY_CARE_PROVIDER_SITE_OTHER): Payer: BC Managed Care – PPO | Admitting: Family Medicine

## 2019-08-13 DIAGNOSIS — I252 Old myocardial infarction: Secondary | ICD-10-CM

## 2019-08-13 DIAGNOSIS — R413 Other amnesia: Secondary | ICD-10-CM

## 2019-08-13 DIAGNOSIS — E785 Hyperlipidemia, unspecified: Secondary | ICD-10-CM | POA: Diagnosis not present

## 2019-08-13 DIAGNOSIS — R5383 Other fatigue: Secondary | ICD-10-CM | POA: Diagnosis not present

## 2019-08-13 DIAGNOSIS — Z1159 Encounter for screening for other viral diseases: Secondary | ICD-10-CM

## 2019-08-13 NOTE — Progress Notes (Signed)
Virtual Visit via Video Note  I connected with Alexander Le  on 08/13/19 at 11:30 AM EDT by a video enabled telemedicine application and verified that I am speaking with the correct person using two identifiers.  Location patient: home Location provider:work or home office Persons participating in the virtual visit: patient, provider  I discussed the limitations of evaluation and management by telemedicine and the availability of in person appointments. The patient expressed understanding and agreed to proceed.   Alexander Le DOB: 1961/11/16 Encounter date: 08/13/2019  This is a 58 y.o. male who presents with No chief complaint on file.   History of present illness:  NSTEMI: follows with cardiology. Has done well with return to exercise. No issues with CP/Chest pressure/SOB.   HL: has been taking lipitor 80mg .   cologuard ordered last visit: hasn't heard about this.   Has been on lexapro for a few years. Struggling with memory issues more than anything else. Was working on phD at that time. Might have been stress related to that. Doc suggested "ciprolex" (name in [place]). Started with 10mg  but decreased to 5mg . Feels like memory is struggling again. Can remember most of it. Went back to full 10mg  tablet and it seems to be helping some (just for past week). Notes this in last 10 years. Likes to read a lot. Retention is what seems to be the issue. Maternal grandmother had some dementia issues. No problems with memory in conversation.   Feels like mood is doing ok. Has been running, push ups. Running 3 miles/night.   Had to stop beta blocker. Tried two - sluggish on first, less sluggish but didn't feel well on second.   Has been eating healthy. Fruits, veggies, limited red meat.    No Known Allergies No outpatient medications have been marked as taking for the 08/13/19 encounter (Telemedicine) with Caren Macadam, MD.    Review of Systems  Constitutional: Negative for  chills, fatigue and fever.  Respiratory: Negative for cough, chest tightness, shortness of breath and wheezing.   Cardiovascular: Negative for chest pain, palpitations and leg swelling.  fatigue has gotten better. Feels better now that he is back to regular running.   Objective:  There were no vitals taken for this visit.      BP Readings from Last 3 Encounters:  07/27/19 108/80  06/02/19 108/64  04/26/19 126/72   Wt Readings from Last 3 Encounters:  07/27/19 173 lb 9.6 oz (78.7 kg)  06/02/19 171 lb 6.4 oz (77.7 kg)  04/26/19 170 lb 6.4 oz (77.3 kg)    EXAM:  GENERAL: alert, oriented, appears well and in no acute distress  HEENT: atraumatic, conjunctiva clear, no obvious abnormalities on inspection of external nose and ears  NECK: normal movements of the head and neck  LUNGS: on inspection no signs of respiratory distress, breathing rate appears normal, no obvious gross SOB, gasping or wheezing  CV: no obvious cyanosis  MS: moves all visible extremities without noticeable abnormality  PSYCH/NEURO: pleasant and cooperative, no obvious depression or anxiety, speech and thought processing grossly intact  Skin: rash has resolved; no facial or neck abnormalities appreciated.   Assessment/Plan  1. Hyperlipidemia, unspecified hyperlipidemia type Continue with lipitor.  We discussed that this medicine could contribute to some issues with memory.  We also discussed that benefits at this point likely outweigh side effects of medication since Lipitor is helping with decreasing risk of cardiac events. - Comprehensive metabolic panel; Future - Lipid panel; Future  2. History of non-ST elevation myocardial infarction (NSTEMI) Following regularly with cardiology.  Blood pressure and cholesterol been well controlled.  He is back to exercising on a regular basis.    3. Memory deficit Has been helped with Lexapro in the past.  This seems to help his focus and clarity overall.  He is  recently restarted full tablet of Lexapro.  We will check some other blood work to see if there is anything else that might be contributing to decrease in memory.  Further evaluation pending this. - Vitamin B12; Future - TSH; Future  4. Other fatigue This has improved somewhat since having myocardial infarction.  He is back to exercising which is helped.  We will obtain some baseline blood work. - CBC with Differential/Platelet; Future - Comprehensive metabolic panel; Future - Vitamin B12; Future - TSH; Future - VITAMIN D 25 Hydroxy (Vit-D Deficiency, Fractures); Future     I discussed the assessment and treatment plan with the patient. The patient was provided an opportunity to ask questions and all were answered. The patient agreed with the plan and demonstrated an understanding of the instructions.   The patient was advised to call back or seek an in-person evaluation if the symptoms worsen or if the condition fails to improve as anticipated.  I provided 30 minutes of non-face-to-face time during this encounter.   Micheline Rough, MD

## 2019-08-16 ENCOUNTER — Telehealth: Payer: Self-pay | Admitting: *Deleted

## 2019-08-16 NOTE — Telephone Encounter (Signed)
-----   Message from Caren Macadam, MD sent at 08/13/2019  1:04 PM EDT ----- Please schedule labs, flu shot for him and please resend cologuard for him since he didn't hear (unless you found something out about that already)

## 2019-08-16 NOTE — Telephone Encounter (Signed)
Left a detailed message at the pts cell number to call back for appts as below.  Also left a message stating the order for Cologuard testing was faxed to [facility].

## 2019-08-19 ENCOUNTER — Other Ambulatory Visit (INDEPENDENT_AMBULATORY_CARE_PROVIDER_SITE_OTHER): Payer: BC Managed Care – PPO

## 2019-08-19 ENCOUNTER — Other Ambulatory Visit: Payer: Self-pay

## 2019-08-19 DIAGNOSIS — Z1159 Encounter for screening for other viral diseases: Secondary | ICD-10-CM | POA: Diagnosis not present

## 2019-08-19 DIAGNOSIS — E785 Hyperlipidemia, unspecified: Secondary | ICD-10-CM

## 2019-08-19 DIAGNOSIS — R5383 Other fatigue: Secondary | ICD-10-CM | POA: Diagnosis not present

## 2019-08-19 DIAGNOSIS — R413 Other amnesia: Secondary | ICD-10-CM | POA: Diagnosis not present

## 2019-08-19 DIAGNOSIS — Z23 Encounter for immunization: Secondary | ICD-10-CM | POA: Diagnosis not present

## 2019-08-19 LAB — COMPREHENSIVE METABOLIC PANEL
ALT: 41 U/L (ref 0–53)
AST: 25 U/L (ref 0–37)
Albumin: 4.3 g/dL (ref 3.5–5.2)
Alkaline Phosphatase: 60 U/L (ref 39–117)
BUN: 19 mg/dL (ref 6–23)
CO2: 28 mEq/L (ref 19–32)
Calcium: 8.9 mg/dL (ref 8.4–10.5)
Chloride: 106 mEq/L (ref 96–112)
Creatinine, Ser: 1.04 mg/dL (ref 0.40–1.50)
GFR: 73.63 mL/min (ref >60.00)
Glucose, Bld: 85 mg/dL (ref 70–99)
Potassium: 4.1 mEq/L (ref 3.5–5.1)
Sodium: 142 mEq/L (ref 135–145)
Total Bilirubin: 1.2 mg/dL (ref 0.2–1.2)
Total Protein: 6.6 g/dL (ref 6.0–8.3)

## 2019-08-19 LAB — CBC WITH DIFFERENTIAL/PLATELET
Basophils Absolute: 0 10*3/uL (ref 0.0–0.1)
Basophils Relative: 0.6 % (ref 0.0–3.0)
Eosinophils Absolute: 0.2 10*3/uL (ref 0.0–0.7)
Eosinophils Relative: 3 % (ref 0.0–5.0)
HCT: 43.9 % (ref 39.0–52.0)
Hemoglobin: 15 g/dL (ref 13.0–17.0)
Lymphocytes Relative: 36.5 % (ref 12.0–46.0)
Lymphs Abs: 2.3 10*3/uL (ref 0.7–4.0)
MCHC: 34.1 g/dL (ref 30.0–36.0)
MCV: 87 fl (ref 78.0–100.0)
Monocytes Absolute: 0.5 10*3/uL (ref 0.1–1.0)
Monocytes Relative: 8.7 % (ref 3.0–12.0)
Neutro Abs: 3.2 10*3/uL (ref 1.4–7.7)
Neutrophils Relative %: 51.2 % (ref 43.0–77.0)
Platelets: 165 10*3/uL (ref 150.0–400.0)
RBC: 5.05 Mil/uL (ref 4.22–5.81)
RDW: 13.5 % (ref 11.5–15.5)
WBC: 6.2 10*3/uL (ref 4.0–10.5)

## 2019-08-19 LAB — LIPID PANEL
Cholesterol: 118 mg/dL (ref 0–200)
HDL: 42.7 mg/dL (ref >39.00)
LDL Cholesterol: 58 mg/dL (ref 0–99)
NonHDL: 75.13
Total CHOL/HDL Ratio: 3
Triglycerides: 86 mg/dL (ref 0.0–149.0)
VLDL: 17.2 mg/dL (ref 0.0–40.0)

## 2019-08-19 LAB — VITAMIN B12: Vitamin B-12: 416 pg/mL (ref 211–911)

## 2019-08-19 LAB — TSH: TSH: 4.31 u[IU]/mL (ref 0.35–4.50)

## 2019-08-19 LAB — VITAMIN D 25 HYDROXY (VIT D DEFICIENCY, FRACTURES): VITD: 38.85 ng/mL (ref 30.00–100.00)

## 2019-08-20 LAB — HEPATITIS C ANTIBODY
Hepatitis C Ab: NONREACTIVE
SIGNAL TO CUT-OFF: 0.02 (ref <1.00)

## 2019-09-13 LAB — COLOGUARD: Cologuard: NEGATIVE

## 2019-09-16 ENCOUNTER — Encounter: Payer: Self-pay | Admitting: Family Medicine

## 2019-09-30 ENCOUNTER — Encounter: Payer: Self-pay | Admitting: Family Medicine

## 2019-10-01 ENCOUNTER — Other Ambulatory Visit: Payer: Self-pay | Admitting: Family Medicine

## 2019-10-01 MED ORDER — ESCITALOPRAM OXALATE 10 MG PO TABS: 10.0000 mg | ORAL_TABLET | Freq: Every day | ORAL | 1 refills | Status: DC

## 2019-12-30 ENCOUNTER — Telehealth: Payer: Self-pay | Admitting: *Deleted

## 2019-12-30 NOTE — Telephone Encounter (Signed)
A message was left, re: his follow up visit. 

## 2020-02-11 ENCOUNTER — Other Ambulatory Visit: Payer: Self-pay | Admitting: Physician Assistant

## 2020-03-01 NOTE — Progress Notes (Signed)
HPI: Follow-up coronary artery disease. Admitted April 2020 and ruled in for non-ST elevation myocardial infarction.  Cardiac catheterization revealed a 65% circumflex, 50% first marginal, occluded right coronary artery and normal LV function.  Patient had PCI of RCA with drug-eluting stent.  Echocardiogram April 2020 showed ejection fraction 55 to 60%, moderate RV dysfunction, mild right atrial enlargement.  Since last seen chest discomfort for 2 continuous weeks but no symptoms similar to his infarct pain.  He denies dyspnea on exertion, orthopnea, PND, exertional chest pain or syncope.  Current Outpatient Medications  Medication Sig Dispense Refill  . aspirin EC 81 MG tablet Take 81 mg by mouth daily.    Marland Kitchen atorvastatin (LIPITOR) 80 MG tablet Take 1 tablet (80 mg total) by mouth daily at 6 PM. 90 tablet 3  . BRILINTA 90 MG TABS tablet TAKE 1 TABLET(90 MG) BY MOUTH TWICE DAILY 180 tablet 1  . escitalopram (LEXAPRO) 10 MG tablet Take 1 tablet (10 mg total) by mouth daily. 90 tablet 1  . Multiple Vitamin (MULTIVITAMIN WITH MINERALS) TABS tablet Take 1 tablet by mouth daily.    . nitroGLYCERIN (NITROSTAT) 0.4 MG SL tablet Place 1 tablet (0.4 mg total) under the tongue every 5 (five) minutes as needed. 25 tablet 2   No current facility-administered medications for this visit.     Past Medical History:  Diagnosis Date  . Hyperlipidemia   . NSTEMI (non-ST elevated myocardial infarction) ([place])    02/21/19 PCI/DES to 100% mRCA, 65% in pLCX, normal EF    Past Surgical History:  Procedure Laterality Date  . CORONARY STENT INTERVENTION N/A 02/22/2019   Procedure: CORONARY STENT INTERVENTION;  Surgeon: [name], [name], MD;  Location: [facility];  Service: Cardiovascular;  Laterality: N/A;  . KIDNEY SURGERY     ureter surgery to help with drainage  . KNEE ARTHROSCOPY WITH ANTERIOR CRUCIATE LIGAMENT (ACL) REPAIR Left   . LEFT HEART CATH AND CORONARY ANGIOGRAPHY N/A 02/22/2019   Procedure: LEFT HEART CATH AND CORONARY ANGIOGRAPHY;  Surgeon: [name], [name], MD;  Location: [facility];  Service: Cardiovascular;  Laterality: N/A;    Social History   Socioeconomic History  . Marital status: Married    Spouse name: Not on file  . Number of children: Not on file  . Years of education: Not on file  . Highest education level: Not on file  Occupational History  . Not on file  Tobacco Use  . Smoking status: Never Smoker  . Smokeless tobacco: Never Used  Substance and Sexual Activity  . Alcohol use: Yes    Alcohol/week: 2.0 standard drinks    Types: 2 Glasses of wine per week  . Drug use: Never  . Sexual activity: Not on file  Other Topics Concern  . Not on file  Social History Narrative  . Not on file   Social Determinants of Health   Financial Resource Strain:   . Difficulty of Paying Living Expenses:   Food Insecurity:   . Worried About Charity fundraiser in the Last Year:   . Arboriculturist in the Last Year:   Transportation Needs:   . Film/video editor (Medical):   Marland Kitchen Lack of Transportation (Non-Medical):   Physical Activity:   . Days of Exercise per Week:   . Minutes of Exercise per Session:   Stress:   . Feeling of Stress :   Social Connections:   . Frequency of Communication  with Friends and Family:   . Frequency of Social Gatherings with Friends and Family:   . Attends Religious Services:   . Active Member of Clubs or Organizations:   . Attends Archivist Meetings:   Marland Kitchen Marital Status:   Intimate Partner Violence:   . Fear of Current or Ex-Partner:   . Emotionally Abused:   Marland Kitchen Physically Abused:   . Sexually Abused:     Family History  Problem Relation Age of Onset  . Heart attack Brother        87s  . Heart attack Mother 93  . Other Father        didn't know father  . Dementia Maternal Grandmother   . Renal Disease Brother   . Other Brother        unknown/?overdose    ROS: no fevers or chills,  productive cough, hemoptysis, dysphasia, odynophagia, melena, hematochezia, dysuria, hematuria, rash, seizure activity, orthopnea, PND, pedal edema, claudication. Remaining systems are negative.  Physical Exam: Well-developed well-nourished in no acute distress.  Skin is warm and dry.  HEENT is normal.  Neck is supple.  Chest is clear to auscultation with normal expansion.  Cardiovascular exam is regular rate and rhythm.  Abdominal exam nontender or distended. No masses palpated. Extremities show no edema. neuro grossly intact  ECG-sinus bradycardia at a rate of 55, no ST changes.  Personally reviewed  A/P  1 CAD-pt denies chest pain; continue ASA and statin. DC brilinta.  2 hyperlipidemia-continue lipitor.  Kirk Ruths, MD

## 2020-03-06 ENCOUNTER — Other Ambulatory Visit: Payer: Self-pay

## 2020-03-06 ENCOUNTER — Ambulatory Visit: Payer: BC Managed Care – PPO | Admitting: Cardiology

## 2020-03-06 ENCOUNTER — Encounter: Payer: Self-pay | Admitting: Cardiology

## 2020-03-06 VITALS — BP 110/70 | HR 16 | Temp 97.5°F | Resp 55 | Ht 69.0 in | Wt 170.2 lb

## 2020-03-06 DIAGNOSIS — I2510 Atherosclerotic heart disease of native coronary artery without angina pectoris: Secondary | ICD-10-CM

## 2020-03-06 DIAGNOSIS — E785 Hyperlipidemia, unspecified: Secondary | ICD-10-CM | POA: Diagnosis not present

## 2020-03-06 NOTE — Patient Instructions (Signed)
Medication Instructions:  STOP BRILINTA  *If you need a refill on your cardiac medications before your next appointment, please call your pharmacy*   Lab Work: If you have labs (blood work) drawn today and your tests are completely normal, you will receive your results only by: Marland Kitchen MyChart Message (if you have MyChart) OR . A paper copy in the mail If you have any lab test that is abnormal or we need to change your treatment, we will call you to review the results.  Follow-Up: At [facility], you and your health needs are our priority.  As part of our continuing mission to provide you with exceptional heart care, we have created designated Provider Care Teams.  These Care Teams include your primary Cardiologist (physician) and Advanced Practice Providers (APPs -  Physician Assistants and Nurse Practitioners) who all work together to provide you with the care you need, when you need it.  We recommend signing up for the patient portal called "MyChart".  Sign up information is provided on this After Visit Summary.  MyChart is used to connect with patients for Virtual Visits (Telemedicine).  Patients are able to view lab/test results, encounter notes, upcoming appointments, etc.  Non-urgent messages can be sent to your provider as well.   To learn more about what you can do with MyChart, go to [email or web address].    Your next appointment:   12 month(s)  The format for your next appointment:   Either In Person or Virtual  Provider:   You may see Kirk Ruths, MD or one of the following Advanced Practice Providers on your designated Care Team:    Kerin Ransom, PA-C  [place], [place]  Coletta Memos, [place]

## 2020-03-23 ENCOUNTER — Other Ambulatory Visit: Payer: Self-pay | Admitting: Family Medicine

## 2020-05-01 ENCOUNTER — Other Ambulatory Visit: Payer: Self-pay

## 2020-05-01 MED ORDER — NITROGLYCERIN 0.4 MG SL SUBL: 0.4000 mg | SUBLINGUAL_TABLET | SUBLINGUAL | 2 refills | Status: DC | PRN

## 2020-05-18 ENCOUNTER — Other Ambulatory Visit: Payer: Self-pay | Admitting: Physician Assistant

## 2020-09-22 ENCOUNTER — Ambulatory Visit: Payer: BC Managed Care – PPO | Admitting: Physician Assistant

## 2020-10-31 ENCOUNTER — Other Ambulatory Visit: Payer: Self-pay | Admitting: Family Medicine

## 2020-12-07 ENCOUNTER — Other Ambulatory Visit: Payer: Self-pay | Admitting: Family Medicine

## 2020-12-18 ENCOUNTER — Other Ambulatory Visit: Payer: Self-pay

## 2020-12-18 ENCOUNTER — Encounter: Payer: Self-pay | Admitting: Dermatology

## 2020-12-18 ENCOUNTER — Ambulatory Visit: Payer: BC Managed Care – PPO | Admitting: Dermatology

## 2020-12-18 DIAGNOSIS — Z1283 Encounter for screening for malignant neoplasm of skin: Secondary | ICD-10-CM | POA: Diagnosis not present

## 2020-12-18 DIAGNOSIS — L821 Other seborrheic keratosis: Secondary | ICD-10-CM

## 2020-12-18 DIAGNOSIS — D1800 Hemangioma unspecified site: Secondary | ICD-10-CM

## 2020-12-18 DIAGNOSIS — D485 Neoplasm of uncertain behavior of skin: Secondary | ICD-10-CM

## 2020-12-18 NOTE — Patient Instructions (Signed)

## 2020-12-23 ENCOUNTER — Other Ambulatory Visit: Payer: Self-pay | Admitting: Family Medicine

## 2020-12-23 ENCOUNTER — Encounter: Payer: Self-pay | Admitting: Dermatology

## 2020-12-23 NOTE — Progress Notes (Addendum)
   New Patient   Subjective  Alexander Le is a 60 y.o. male who presents for the following: Skin Problem (Right inner cheek & left temple- "dark spots").  The following portions of the chart were reviewed this encounter and updated as appropriate:  Tobacco  Allergies  Meds  Problems  Med Hx  Surg Hx  Fam Hx      Objective  Well appearing patient in no apparent distress; mood and affect are within normal limits. Objective  Left Breast: Waist up skin examination- no atypical moles or non mole skin cancer  Objective  Right Breast: 6 mm textured flattopped brown papule  Objective  Right Malar Cheek: Slowly enlarging crusted 4 mm papule     Objective  Left Inframammary Fold: Multiple red raised spots   All skin waist up examined.   Assessment & Plan  Encounter for screening for malignant neoplasm of skin Left Breast  Yearly skin check  Seborrheic keratosis Right Breast  Okay to leave if stable  Neoplasm of uncertain behavior of skin Right Malar Cheek  Skin / nail biopsy Type of biopsy: tangential   Informed consent: discussed and consent obtained   Timeout: patient name, date of birth, surgical site, and procedure verified   Procedure prep:  Patient was prepped and draped in usual sterile fashion (Non sterile) Prep type:  Chlorhexidine Anesthesia: the lesion was anesthetized in a standard fashion   Anesthetic:  1% lidocaine w/ epinephrine 1-100,000 local infiltration Instrument used: scissors   Outcome: patient tolerated procedure well   Post-procedure details: wound care instructions given   Additional details:  Cautery only  Specimen 1 - Surgical pathology Differential Diagnosis: R/o sk Check Margins: No  Hemangioma, unspecified site Left Inframammary Fold  Okay to leave if stable

## 2020-12-26 NOTE — Addendum Note (Signed)
Addended by: Robyne Askew R on: 12/26/2020 10:07 AM   Modules accepted: Level of Service

## 2021-01-06 ENCOUNTER — Encounter: Payer: Self-pay | Admitting: Family Medicine

## 2021-01-08 MED ORDER — ESCITALOPRAM OXALATE 10 MG PO TABS: ORAL_TABLET | ORAL | 0 refills | Status: DC

## 2021-01-22 ENCOUNTER — Other Ambulatory Visit: Payer: Self-pay | Admitting: Family Medicine

## 2021-01-29 ENCOUNTER — Ambulatory Visit: Payer: BC Managed Care – PPO | Admitting: Dermatology

## 2021-02-07 ENCOUNTER — Ambulatory Visit: Payer: BC Managed Care – PPO | Admitting: Dermatology

## 2021-02-19 ENCOUNTER — Telehealth: Payer: Self-pay | Admitting: Cardiology

## 2021-02-19 MED ORDER — NITROGLYCERIN 0.4 MG SL SUBL: 0.4000 mg | SUBLINGUAL_TABLET | SUBLINGUAL | 2 refills | Status: DC | PRN

## 2021-02-19 NOTE — Telephone Encounter (Signed)
Returned the call to the patient. He stated that this morning after he got home from the gym that he felt dizzy and swimmy headed. He denies chest pain and shortness of breath. He stated that this lasted around 30 minutes and his blood pressure was 144/80 and heart rate was 56. He stated that he drank half grape juice and half water while working out. He goes to the gym three times a week and has been for the past year. He has not had an episode like this before.  He does have a history of a cardiac cath and stents in 2020.  Blood pressure while on the phone was 127/70 and heart rate was 71.  He has a video visit tomorrow with Coletta Memos, NP. A refill of nitro has been sent in for him as well per his request.

## 2021-02-19 NOTE — Telephone Encounter (Signed)
Pt c/o BP issue: STAT if pt c/o blurred vision, one-sided weakness or slurred speech  1. What are your last 5 BP readings? 144/80 resting about 3 minutes ago  2. Are you having any other symptoms (ex. Dizziness, headache, blurred vision, passed out)? Extreme dizziness, nausea, room felt like it was spinning  3. What is your BP issue? Patient's wife states the patient's BP was high for him today. She states he had an episode today around 2 hours ago where he was extremely dizzy and almost collapsed. She states he also felt nausea. She states he is okay now, but is concerned about this episode. Please advise.

## 2021-02-19 NOTE — Progress Notes (Addendum)
Virtual Visit via Video Note   This visit type was conducted due to national recommendations for restrictions regarding the COVID-19 Pandemic (e.g. social distancing) in an effort to limit this patient's exposure and mitigate transmission in our community.  Due to his co-morbid illnesses, this patient is at least at moderate risk for complications without adequate follow up.  This format is felt to be most appropriate for this patient at this time.  All issues noted in this document were discussed and addressed.  A limited physical exam was performed with this format.  Please refer to the patient's chart for his consent to telehealth for [facility].     Evaluation Performed:  Follow-up visit  This visit type was conducted due to national recommendations for restrictions regarding the COVID-19 Pandemic (e.g. social distancing).  This format is felt to be most appropriate for this patient at this time.  All issues noted in this document were discussed and addressed.  No physical exam was performed (except for noted visual exam findings with Video Visits).  Please refer to the patient's chart (MyChart message for video visits and phone note for telephone visits) for the patient's consent to telehealth for [place]  Date:  02/20/2021   ID:  Alexander Le, DOB 03/12/1961, MRN [id number]  Patient Location:  [place] [address]   Provider location:     [address] Office [phone] Fax [phone]   PCP:  Caren Macadam, MD  Cardiologist:  Kirk Ruths, MD  Electrophysiologist:  None   Chief Complaint: Follow-up for dizziness/hypertension  History of Present Illness:    Alexander Le is a 60 y.o. male who presents via audio/video conferencing for a telehealth visit today.  Patient verified DOB and address.  He was admitted April 2020 with NSTEMI.  He underwent cardiac  catheterization which showed 65% circumflex, 50% first marginal, occluded RCA and normal LV function.  He received PCI to his RCA with DES.  Echocardiogram 4/20 showed an EF of 55-60%, moderate RV dysfunction, mild right atrial enlargement.  He was seen by Dr. Stanford Breed on 03/06/2020.  During that time he reported 2 previous episodes of chest discomfort.  However, they were not similar to his symptoms when he had his NSTEMI.  He denied dyspnea on exertion, orthopnea, PND, exertional chest pain and syncope.  He contacted nurse triage line on 02/19/2021 with complaints of elevated blood pressure and dizziness.  His blood pressure was in the 140s over 80s.  He is contacted virtually today for follow-up and states he noted 2 episodes of dizziness and increased blood pressure yesterday.  He had just been to the gym and was driving home.  He contacted his wife who sent a message to the office.  He reports his blood pressures were in the 140s over 80s.  Typically his blood pressures are in the 110s over 70s.  He had not changed any of his normal daily routines and reports he had not had any different medications or food/drink.  He had been to his sons over the weekend who was sick with GI distress.  He reports that today he has had no further episodes of dizziness and his blood pressure was 128/71.  He reports he stays well-hydrated.  He has been routinely going to the gym 3 days/week and been using his treadmill daily.  I have instructed him to keep a blood pressure log, diet log,  avoid secondary causes of hypertension, I will give him salty 6 diet sheet, and order a CBC, magnesium, and BMP.  He reports he will go to his PCP to have his labs drawn.  We will follow-up in 1 month.  Discussed possibility of irregular heartbeat.  Would recommend cardiac event monitor if increased episodes of dizziness.  Today he denies chest pain, shortness of breath, lower extremity edema, fatigue, palpitations, melena, hematuria,  hemoptysis presyncope, syncope, orthopnea, and PND.   The patient does not symptoms concerning for COVID-19 infection (fever, chills, cough, or new SHORTNESS OF BREATH).    Prior CV studies:   The following studies were reviewed today:  Echocardiogram 02/21/2019 IMPRESSIONS    1. The left ventricle has normal systolic function, with an ejection  fraction of 55-60%. The cavity size was normal. Left ventricular diastolic  parameters were normal.  2. Mild hypokinesis of the left ventricular, basal-mid inferior wall.  3. The right ventricle has moderately reduced systolic function. The  cavity was normal. There is mildly increased right ventricular wall  thickness.  4. The mitral valve is abnormal. Mild thickening of the mitral valve  leaflet.  5. The tricuspid valve is grossly normal.  6. The aortic valve is tricuspid. Mild calcification of the aortic valve.  7. The aortic root and ascending aorta are normal in size and structure.  8. The inferior vena cava was dilated in size with <50% respiratory  variability.  9. Right atrial size was mildly dilated.  10. The interatrial septum was not assessed.  Past Medical History:  Diagnosis Date  . Hyperlipidemia   . NSTEMI (non-ST elevated myocardial infarction) ([place])    02/21/19 PCI/DES to 100% mRCA, 65% in pLCX, normal EF   Past Surgical History:  Procedure Laterality Date  . CORONARY STENT INTERVENTION N/A 02/22/2019   Procedure: CORONARY STENT INTERVENTION;  Surgeon: [name], [name], MD;  Location: [place] [facility];  Service: Cardiovascular;  Laterality: N/A;  . KIDNEY SURGERY     ureter surgery to help with drainage  . KNEE ARTHROSCOPY WITH ANTERIOR CRUCIATE LIGAMENT (ACL) REPAIR Left   . LEFT HEART CATH AND CORONARY ANGIOGRAPHY N/A 02/22/2019   Procedure: LEFT HEART CATH AND CORONARY ANGIOGRAPHY;  Surgeon: [name], [name], MD;  Location: [place] [facility];  Service: Cardiovascular;  Laterality: N/A;     Current Meds   Medication Sig  . aspirin EC 81 MG tablet Take 81 mg by mouth daily.  Marland Kitchen atorvastatin (LIPITOR) 80 MG tablet TAKE 1 TABLET(80 MG) BY MOUTH DAILY AT 6 PM  . escitalopram (LEXAPRO) 10 MG tablet TAKE 1/2 TABLET(5 MG) BY MOUTH DAILY  . Multiple Vitamin (MULTIVITAMIN WITH MINERALS) TABS tablet Take 1 tablet by mouth daily.  . nitroGLYCERIN (NITROSTAT) 0.4 MG SL tablet Place 1 tablet (0.4 mg total) under the tongue every 5 (five) minutes as needed.     Allergies:   Patient has no known allergies.   Social History   Tobacco Use  . Smoking status: Never Smoker  . Smokeless tobacco: Never Used  Substance Use Topics  . Alcohol use: Yes    Alcohol/week: 2.0 standard drinks    Types: 2 Glasses of wine per week  . Drug use: Never     Family Hx: The patient's family history includes Dementia in his maternal grandmother; Heart attack in his brother; Heart attack (age of onset: 78) in his mother; Other in his brother and father; Renal Disease in his brother.  ROS:   Please  see the history of present illness.     All other systems reviewed and are negative.   Labs/Other Tests and Data Reviewed:    Recent Labs: No results found for requested labs within last 8760 hours.   Recent Lipid Panel Lab Results  Component Value Date/Time   CHOL 118 08/19/2019 07:39 AM   TRIG 86.0 08/19/2019 07:39 AM   HDL 42.70 08/19/2019 07:39 AM   CHOLHDL 3 08/19/2019 07:39 AM   LDLCALC 58 08/19/2019 07:39 AM    Wt Readings from Last 3 Encounters:  02/20/21 164 lb (74.4 kg)  03/06/20 170 lb 3.2 oz (77.2 kg)  07/27/19 173 lb 9.6 oz (78.7 kg)     Exam:    Vital Signs:  BP 128/71   Pulse 65   Wt 164 lb (74.4 kg)   BMI 24.22 kg/m    Well nourished, well developed male in no  acute distress.   ASSESSMENT & PLAN:    1.  Essential hypertension/dizziness-blood pressure today 128/71.  Previously well controlled at home 110/70.  Reports 2 episodes of dizziness and elevated blood pressure yesterday  02/19/2021.  Denies dietary changes, increase stresses.  Happened after being at the gym and lifting weights. Secondary causes of hypertension reviewed/given Heart healthy low-sodium diet-salty 6 given Diet log/blood pressure log Maintain physical activity as tolerated Maintain p.o. hydration Order CBC, BMP Recommend cardiac event monitor if continued episodes.  Coronary artery disease-no chest pain today.  Underwent cardiac catheterization which showed 65% circumflex, 50% marginal, occluded RCA and received PCI and DES to his RCA. Continue aspirin, atorvastatin, nitroglycerin Heart healthy low-sodium diet-salty 6 given Increase physical activity as tolerated  Hyperlipidemia-LDL 58 on 08/19/2019 Continue atorvastatin Heart healthy low-sodium high-fiber diet.   Increase physical activity as tolerated   Patient Risk:   After full review of this patients clinical status, I feel that they are at least moderate risk at this time.  Time:   Today, I have spent 14 minutes with the patient with telehealth technology discussing diet, exercise, medication, secondary causes of hypertension, sick contacts.  I spent greater than 20 minutes reviewing his past medical history, cardiac medications, and prior cardiac test.   Medication Adjustments/Labs and Tests Ordered: Current medicines are reviewed at length with the patient today.  Concerns regarding medicines are outlined above.   Tests Ordered: No orders of the defined types were placed in this encounter.  Medication Changes: No orders of the defined types were placed in this encounter.   Disposition:  in 1 month(s)  Signed, Jossie Ng. Zakira Ressel NP-C    06/22/2019 11:58 AM    [address] Office [phone] Fax [phone]

## 2021-02-20 ENCOUNTER — Encounter: Payer: Self-pay | Admitting: Family Medicine

## 2021-02-20 ENCOUNTER — Telehealth (INDEPENDENT_AMBULATORY_CARE_PROVIDER_SITE_OTHER): Payer: BC Managed Care – PPO | Admitting: General Practice

## 2021-02-20 ENCOUNTER — Encounter: Payer: Self-pay | Admitting: General Practice

## 2021-02-20 VITALS — BP 128/71 | HR 65 | Wt 164.0 lb

## 2021-02-20 DIAGNOSIS — Z79899 Other long term (current) drug therapy: Secondary | ICD-10-CM

## 2021-02-20 DIAGNOSIS — I2510 Atherosclerotic heart disease of native coronary artery without angina pectoris: Secondary | ICD-10-CM | POA: Diagnosis not present

## 2021-02-20 DIAGNOSIS — E785 Hyperlipidemia, unspecified: Secondary | ICD-10-CM

## 2021-02-20 DIAGNOSIS — I10 Essential (primary) hypertension: Secondary | ICD-10-CM | POA: Diagnosis not present

## 2021-02-20 DIAGNOSIS — R030 Elevated blood-pressure reading, without diagnosis of hypertension: Secondary | ICD-10-CM

## 2021-02-20 DIAGNOSIS — R42 Dizziness and giddiness: Secondary | ICD-10-CM

## 2021-02-20 DIAGNOSIS — Z862 Personal history of diseases of the blood and blood-forming organs and certain disorders involving the immune mechanism: Secondary | ICD-10-CM

## 2021-02-20 DIAGNOSIS — Z131 Encounter for screening for diabetes mellitus: Secondary | ICD-10-CM

## 2021-02-20 NOTE — Telephone Encounter (Signed)
Agree with FU visit as scheduled and continue to follow Kirk Ruths

## 2021-02-20 NOTE — Patient Instructions (Addendum)
Medication Instructions:  The current medical regimen is effective;  continue present plan and medications as directed. Please refer to the Current Medication list given to you today.  *If you need a refill on your cardiac medications before your next appointment, please call your pharmacy*  Lab Work: BMET, MAG AND CBC-THESE ARE NOT FASTING If you have labs (blood work) drawn today and your tests are completely normal, you will receive your results only by:  [place] (if you have MyChart) OR A paper copy in the mail.  If you have any lab test that is abnormal or we need to change your treatment, we will call you to review the results. You may go to any [facility] that is convenient for you however, we do have a lab in our office that is able to assist you. You DO NOT need an appointment for our lab. The lab is open 8:00am and closes at 4:00pm. Lunch 12:45 - 1:45pm.  Testing/Procedures: NONE  Special Instructions Secondary Causes of Hypertension(Please avoid) Steroids, stimulants, antidepressants, weight loss medications, immune suppressants, NSAIDs, sympathomimetics, alcohol, caffeine, licorice, ginseng, St. John's wort  PLEASE READ AND FOLLOW SALTY 6-ATTACHED-1,800 mg daily  TAKE AND LOG YOUR BLOOD PRESSURE DAILY, ONE HOUR AFTER TAKING MEDICATION.  PLEASE MAKE A DIET LOG AND LOG EVERY THING YOU EAT FOOD AND DRINK.  Follow-Up: Your next appointment:  1 month(s) In Person with You may see Kirk Ruths, MD OR IF UNAVAILABLE JESSE CLEAVER, FNP-C  At [facility], you and your health needs are our priority.  As part of our continuing mission to provide you with exceptional heart care, we have created designated Provider Care Teams.  These Care Teams include your primary Cardiologist (physician) and Advanced Practice Providers (APPs -  Physician Assistants and Nurse Practitioners) who all work together to provide you with the care you need, when you need it.

## 2021-02-21 NOTE — Telephone Encounter (Signed)
Discussed at video visit 02/20/21

## 2021-02-23 ENCOUNTER — Other Ambulatory Visit (INDEPENDENT_AMBULATORY_CARE_PROVIDER_SITE_OTHER): Payer: BC Managed Care – PPO

## 2021-02-23 ENCOUNTER — Other Ambulatory Visit: Payer: Self-pay

## 2021-02-23 DIAGNOSIS — Z862 Personal history of diseases of the blood and blood-forming organs and certain disorders involving the immune mechanism: Secondary | ICD-10-CM | POA: Diagnosis not present

## 2021-02-23 DIAGNOSIS — Z131 Encounter for screening for diabetes mellitus: Secondary | ICD-10-CM | POA: Diagnosis not present

## 2021-02-23 DIAGNOSIS — E785 Hyperlipidemia, unspecified: Secondary | ICD-10-CM

## 2021-02-23 DIAGNOSIS — R42 Dizziness and giddiness: Secondary | ICD-10-CM | POA: Diagnosis not present

## 2021-02-23 LAB — LIPID PANEL
Cholesterol: 116 mg/dL (ref 0–200)
HDL: 43.9 mg/dL (ref >39.00)
LDL Cholesterol: 59 mg/dL (ref 0–99)
NonHDL: 72.57
Total CHOL/HDL Ratio: 3
Triglycerides: 69 mg/dL (ref 0.0–149.0)
VLDL: 13.8 mg/dL (ref 0.0–40.0)

## 2021-02-23 LAB — CBC WITH DIFFERENTIAL/PLATELET
Basophils Absolute: 0.1 10*3/uL (ref 0.0–0.1)
Basophils Relative: 1.1 % (ref 0.0–3.0)
Eosinophils Absolute: 0.3 10*3/uL (ref 0.0–0.7)
Eosinophils Relative: 5.2 % — ABNORMAL HIGH (ref 0.0–5.0)
HCT: 43.1 % (ref 39.0–52.0)
Hemoglobin: 14.9 g/dL (ref 13.0–17.0)
Lymphocytes Relative: 36.6 % (ref 12.0–46.0)
Lymphs Abs: 1.8 10*3/uL (ref 0.7–4.0)
MCHC: 34.6 g/dL (ref 30.0–36.0)
MCV: 85.8 fl (ref 78.0–100.0)
Monocytes Absolute: 0.4 10*3/uL (ref 0.1–1.0)
Monocytes Relative: 8.6 % (ref 3.0–12.0)
Neutro Abs: 2.4 10*3/uL (ref 1.4–7.7)
Neutrophils Relative %: 48.5 % (ref 43.0–77.0)
Platelets: 172 10*3/uL (ref 150.0–400.0)
RBC: 5.03 Mil/uL (ref 4.22–5.81)
RDW: 13.7 % (ref 11.5–15.5)
WBC: 4.9 10*3/uL (ref 4.0–10.5)

## 2021-02-23 LAB — FERRITIN: Ferritin: 109 ng/mL (ref 22.0–322.0)

## 2021-02-23 LAB — COMPREHENSIVE METABOLIC PANEL
ALT: 35 U/L (ref 0–53)
AST: 25 U/L (ref 0–37)
Albumin: 4 g/dL (ref 3.5–5.2)
Alkaline Phosphatase: 60 U/L (ref 39–117)
BUN: 22 mg/dL (ref 6–23)
CO2: 29 mEq/L (ref 19–32)
Calcium: 8.7 mg/dL (ref 8.4–10.5)
Chloride: 105 mEq/L (ref 96–112)
Creatinine, Ser: 1.08 mg/dL (ref 0.40–1.50)
GFR: 75.68 mL/min (ref >60.00)
Glucose, Bld: 95 mg/dL (ref 70–99)
Potassium: 4.5 mEq/L (ref 3.5–5.1)
Sodium: 139 mEq/L (ref 135–145)
Total Bilirubin: 0.7 mg/dL (ref 0.2–1.2)
Total Protein: 6.3 g/dL (ref 6.0–8.3)

## 2021-02-23 LAB — VITAMIN B12: Vitamin B-12: 409 pg/mL (ref 211–911)

## 2021-02-23 LAB — IBC PANEL
Iron: 56 ug/dL (ref 42–165)
Saturation Ratios: 22 % (ref 20.0–50.0)
Transferrin: 182 mg/dL — ABNORMAL LOW (ref 212.0–360.0)

## 2021-02-23 LAB — HEMOGLOBIN A1C: Hgb A1c MFr Bld: 5.2 % (ref 4.6–6.5)

## 2021-02-23 LAB — TSH: TSH: 2.13 u[IU]/mL (ref 0.35–4.50)

## 2021-02-28 LAB — MAGNESIUM, RBC: Magnesium RBC: 5.6 mg/dL (ref 4.0–6.4)

## 2021-03-09 ENCOUNTER — Encounter: Payer: Self-pay | Admitting: Family Medicine

## 2021-03-22 NOTE — Progress Notes (Signed)
All  Cardiology Clinic Note   Patient Name: Alexander Le Date of Encounter: 03/22/2021  Primary Care Provider:  Caren Macadam, MD Primary Cardiologist:  Kirk Ruths, MD  Patient Profile    Alexander Le 60 year old male presents the clinic for follow-up evaluation of his hypertension.  Past Medical History    Past Medical History:  Diagnosis Date  . Hyperlipidemia   . NSTEMI (non-ST elevated myocardial infarction) ([place])    02/21/19 PCI/DES to 100% mRCA, 65% in pLCX, normal EF   Past Surgical History:  Procedure Laterality Date  . CORONARY STENT INTERVENTION N/A 02/22/2019   Procedure: CORONARY STENT INTERVENTION;  Surgeon: [name], [name], MD;  Location: [place] [facility];  Service: Cardiovascular;  Laterality: N/A;  . KIDNEY SURGERY     ureter surgery to help with drainage  . KNEE ARTHROSCOPY WITH ANTERIOR CRUCIATE LIGAMENT (ACL) REPAIR Left   . LEFT HEART CATH AND CORONARY ANGIOGRAPHY N/A 02/22/2019   Procedure: LEFT HEART CATH AND CORONARY ANGIOGRAPHY;  Surgeon: [name], [name], MD;  Location: [place] [facility];  Service: Cardiovascular;  Laterality: N/A;    Allergies  No Known Allergies  History of Present Illness    Alexander Le has a PMH of essential hypertension, coronary artery disease, and hyperlipidemia.  He is a Automotive engineer.  He was admitted April 2020 with NSTEMI.  He underwent cardiac catheterization which showed 65% circumflex, 50% first marginal, occluded RCA and normal LV function.  He received PCI to his RCA with DES.  Echocardiogram 4/20 showed an EF of 55-60%, moderate RV dysfunction, mild right atrial enlargement.  He was seen by Dr. Stanford Breed on 03/06/2020.  During that time he reported 2 previous episodes of chest discomfort.  However, they were not similar to his symptoms when he had his NSTEMI.  He denied dyspnea on exertion, orthopnea, PND, exertional chest pain and syncope.  He contacted nurse triage line on 02/19/2021 with complaints of  elevated blood pressure and dizziness.  His blood pressure was in the 140s over 80s.  He was contacted virtually 02/20/21 for follow-up and stated he noted 2 episodes of dizziness and increased blood pressure 02/19/21.  He had just been to the gym and was driving home.  He contacted his wife who sent a message to the office.  He reported his blood pressures were in the 140s over 80s.  Typically his blood pressures were in the 110s over 70s.  He had not changed any of his normal daily routines and reported he had not had any different medications or food/drink.  He had been to his sons over the weekend who was sick with GI distress.  He reported that  he has had no further episodes of dizziness and his blood pressure was 128/71.  He reported he stayed well-hydrated.  He had been routinely going to the gym 3 days/week and been using his treadmill daily.  I  instructed him to keep a blood pressure log, diet log, avoid secondary causes of hypertension, I  gave him salty 6 diet sheet, and ordered a CBC, magnesium, and BMP.  He reported he would go to his PCP to have his labs drawn.  We planned follow-up in 1 month.  Discussed possibility of irregular heartbeat.  I recommended cardiac event monitor if increased episodes of dizziness.  Who presents to the clinic today for follow-up evaluation states he is recovering from a COVID-19 infection.  He had mild symptoms that lasted for about a week.  He is currently back to his exercise routine and is slightly fatigued.  He reports that his wife also contracted COVID but is doing well.  His son was running 19 m, was tripped, and sustained a wrist fracture.  This required ORIF with a plate.  He has had no further episodes of dizziness or hypertension.  We discussed the possibility of cardiac arrhythmia.  We reviewed his recent lab work.  We used shared decision making and will defer further testing at this time.  Today he denies chest pain, shortness of breath, lower  extremity edema, fatigue, palpitations, melena, hematuria, hemoptysis presyncope, syncope, orthopnea, and PND.   Home Medications    Prior to Admission medications   Medication Sig Start Date End Date Taking? Authorizing Provider  aspirin EC 81 MG tablet Take 81 mg by mouth daily.    [provider]  atorvastatin (LIPITOR) 80 MG tablet TAKE 1 TABLET(80 MG) BY MOUTH DAILY AT 6 PM 05/18/20   Almyra Deforest, PA  escitalopram (LEXAPRO) 10 MG tablet TAKE 1/2 TABLET(5 MG) BY MOUTH DAILY 01/22/21   Caren Macadam, MD  Multiple Vitamin (MULTIVITAMIN WITH MINERALS) TABS tablet Take 1 tablet by mouth daily.    [provider]  nitroGLYCERIN (NITROSTAT) 0.4 MG SL tablet Place 1 tablet (0.4 mg total) under the tongue every 5 (five) minutes as needed. 02/19/21   [name], MD    Family History    Family History  Problem Relation Age of Onset  . Heart attack Brother        38s  . Heart attack Mother 92  . Other Father        didn't know father  . Dementia Maternal Grandmother   . Renal Disease Brother   . Other Brother        unknown/?overdose   He indicated that his mother is deceased. He indicated that his father is alive. He indicated that his sister is alive. He indicated that four of his six brothers are alive. He indicated that his maternal grandmother is deceased. He indicated that his maternal grandfather is deceased.  Social History    Social History   Socioeconomic History  . Marital status: Married    Spouse name: Not on file  . Number of children: Not on file  . Years of education: Not on file  . Highest education level: Not on file  Occupational History  . Not on file  Tobacco Use  . Smoking status: Never Smoker  . Smokeless tobacco: Never Used  Substance and Sexual Activity  . Alcohol use: Yes    Alcohol/week: 2.0 standard drinks    Types: 2 Glasses of wine per week  . Drug use: Never  . Sexual activity: Not on file  Other Topics Concern  . Not  on file  Social History Narrative  . Not on file   Social Determinants of Health   Financial Resource Strain: Not on file  Food Insecurity: Not on file  Transportation Needs: Not on file  Physical Activity: Not on file  Stress: Not on file  Social Connections: Not on file  Intimate Partner Violence: Not on file     Review of Systems    General:  No chills, fever, night sweats or weight changes.  Cardiovascular:  No chest pain, dyspnea on exertion, edema, orthopnea, palpitations, paroxysmal nocturnal dyspnea. Dermatological: No rash, lesions/masses Respiratory: No cough, dyspnea Urologic: No hematuria, dysuria Abdominal:   No nausea, vomiting, diarrhea, bright red blood per  rectum, melena, or hematemesis Neurologic:  No visual changes, wkns, changes in mental status. All other systems reviewed and are otherwise negative except as noted above.  Physical Exam    VS:  There were no vitals taken for this visit. , BMI There is no height or weight on file to calculate BMI. GEN: Well nourished, well developed, in no acute distress. HEENT: normal. Neck: Supple, no JVD, carotid bruits, or masses. Cardiac: RRR, no murmurs, rubs, or gallops. No clubbing, cyanosis, edema.  Radials/DP/PT 2+ and equal bilaterally.  Respiratory:  Respirations regular and unlabored, clear to auscultation bilaterally. GI: Soft, nontender, nondistended, BS + x 4. MS: no deformity or atrophy. Skin: warm and dry, no rash. Neuro:  Strength and sensation are intact. Psych: Normal affect.  Accessory Clinical Findings    Recent Labs: 02/23/2021: ALT 35; BUN 22; Creatinine, Ser 1.08; Hemoglobin 14.9; Platelets 172.0; Potassium 4.5; Sodium 139; TSH 2.13   Recent Lipid Panel    Component Value Date/Time   CHOL 116 02/23/2021 0804   TRIG 69.0 02/23/2021 0804   HDL 43.90 02/23/2021 0804   CHOLHDL 3 02/23/2021 0804   VLDL 13.8 02/23/2021 0804   LDLCALC 59 02/23/2021 0804    ECG personally reviewed by me  today-normal sinus rhythm no ST or T wave deviation 60 bpm- No acute changes  Echocardiogram 02/21/2019 IMPRESSIONS    1. The left ventricle has normal systolic function, with an ejection  fraction of 55-60%. The cavity size was normal. Left ventricular diastolic  parameters were normal.  2. Mild hypokinesis of the left ventricular, basal-mid inferior wall.  3. The right ventricle has moderately reduced systolic function. The  cavity was normal. There is mildly increased right ventricular wall  thickness.  4. The mitral valve is abnormal. Mild thickening of the mitral valve  leaflet.  5. The tricuspid valve is grossly normal.  6. The aortic valve is tricuspid. Mild calcification of the aortic valve.  7. The aortic root and ascending aorta are normal in size and structure.  8. The inferior vena cava was dilated in size with <50% respiratory  variability.  9. Right atrial size was mildly dilated.  10. The interatrial septum was not assessed.  Assessment & Plan   1.   Essential hypertension/dizziness-blood pressure today 100/62.  Well controlled at home.  No further episodes of elevated blood pressure or dizziness. Maintain physical activity as tolerated Maintain p.o. hydration  Coronary artery disease-no chest pain today.  Underwent cardiac catheterization which showed 65% circumflex, 50% marginal, occluded RCA and received PCI and DES to his RCA. Continue aspirin, atorvastatin, nitroglycerin Heart healthy low-sodium diet-salty 6 given Maintain physical activity  Hyperlipidemia-LDL 58 on 08/19/2019 Continue atorvastatin Heart healthy low-sodium high-fiber diet Maintain physical activity  Disposition: Follow-up with Dr. Stanford Breed in 12 months.  Jossie Ng. Shanyiah Conde NP-C    03/22/2021, 7:51 AM [address] Office [phone] Fax [phone]  Notice: This dictation was prepared with Dragon dictation along with smaller  phrase technology. Any transcriptional errors that result from this process are unintentional and may not be corrected upon review.  I spent 9 minutes examining this patient, reviewing medications, and using patient centered shared decision making involving her cardiac care.  Prior to her visit I spent greater than 20 minutes reviewing her past medical history,  medications, and prior cardiac tests.

## 2021-03-23 ENCOUNTER — Encounter: Payer: Self-pay | Admitting: General Practice

## 2021-03-23 ENCOUNTER — Other Ambulatory Visit: Payer: Self-pay

## 2021-03-23 ENCOUNTER — Ambulatory Visit: Payer: BC Managed Care – PPO | Admitting: General Practice

## 2021-03-23 VITALS — BP 100/62 | HR 60 | Ht 69.0 in | Wt 171.4 lb

## 2021-03-23 DIAGNOSIS — I2510 Atherosclerotic heart disease of native coronary artery without angina pectoris: Secondary | ICD-10-CM | POA: Diagnosis not present

## 2021-03-23 DIAGNOSIS — R42 Dizziness and giddiness: Secondary | ICD-10-CM | POA: Diagnosis not present

## 2021-03-23 DIAGNOSIS — I10 Essential (primary) hypertension: Secondary | ICD-10-CM | POA: Diagnosis not present

## 2021-03-23 DIAGNOSIS — E785 Hyperlipidemia, unspecified: Secondary | ICD-10-CM | POA: Diagnosis not present

## 2021-03-23 NOTE — Patient Instructions (Signed)
Medication Instructions:  The current medical regimen is effective;  continue present plan and medications as directed. Please refer to the Current Medication list given to you today. *If you need a refill on your cardiac medications before your next appointment, please call your pharmacy*  Lab Work:   Testing/Procedures:  NONE    NONE  Follow-Up: Your next appointment:  12 month(s) In Person with You may see Kirk Ruths, MD or one of the following Advanced Practice Providers on your designated Care Team:    Sande Rives, PA-C  Coletta Memos, FNP  OR IF UNAVAILABLE Coletta Memos, FNP-C   Please call our office 2 months in advance to schedule this appointment   At [facility], you and your health needs are our priority.  As part of our continuing mission to provide you with exceptional heart care, we have created designated Provider Care Teams.  These Care Teams include your primary Cardiologist (physician) and Advanced Practice Providers (APPs -  Physician Assistants and Nurse Practitioners) who all work together to provide you with the care you need, when you need it.  We recommend signing up for the patient portal called "MyChart".  Sign up information is provided on this After Visit Summary.  MyChart is used to connect with patients for Virtual Visits (Telemedicine).  Patients are able to view lab/test results, encounter notes, upcoming appointments, etc.  Non-urgent messages can be sent to your provider as well.   To learn more about what you can do with MyChart, go to [email or web address].

## 2021-04-02 ENCOUNTER — Other Ambulatory Visit: Payer: Self-pay

## 2021-04-02 ENCOUNTER — Ambulatory Visit (INDEPENDENT_AMBULATORY_CARE_PROVIDER_SITE_OTHER): Payer: BC Managed Care – PPO | Admitting: Family Medicine

## 2021-04-02 ENCOUNTER — Encounter: Payer: Self-pay | Admitting: Family Medicine

## 2021-04-02 VITALS — BP 100/68 | HR 64 | Temp 97.7°F | Ht 69.25 in | Wt 169.7 lb

## 2021-04-02 DIAGNOSIS — Z0000 Encounter for general adult medical examination without abnormal findings: Secondary | ICD-10-CM

## 2021-04-02 DIAGNOSIS — I2510 Atherosclerotic heart disease of native coronary artery without angina pectoris: Secondary | ICD-10-CM | POA: Diagnosis not present

## 2021-04-02 DIAGNOSIS — E785 Hyperlipidemia, unspecified: Secondary | ICD-10-CM

## 2021-04-02 DIAGNOSIS — B351 Tinea unguium: Secondary | ICD-10-CM

## 2021-04-02 DIAGNOSIS — R413 Other amnesia: Secondary | ICD-10-CM

## 2021-04-02 MED ORDER — CICLOPIROX 8 % EX SOLN: Freq: Every day | CUTANEOUS | 5 refills | Status: DC

## 2021-04-02 MED ORDER — ESCITALOPRAM OXALATE 10 MG PO TABS: 10.0000 mg | ORAL_TABLET | Freq: Every day | ORAL | 1 refills | Status: DC

## 2021-04-02 NOTE — Progress Notes (Signed)
Alexander Le DOB: 1961-03-17 Encounter date: 04/02/2021  This is a 60 y.o. male who presents for complete physical   History of present illness/Additional concerns: A few weeks ago had episode of spinning - room spinning around him; lasted for about 10 minutes. Called wife. Happened again later in the afternoon; not as bad that time. Started when he was on the way home driving - road seemed like it was moving. Was coming back from workout; but works out at least a few days/week and doesn't bother him with that. Usually runs 4 miles multiple times/week. At time of vertigo - had elevated pressure, but has been low since then - 100's/60-70 range. Seems like it has been this way since having COVID.   Had COVID a couple weeks ago - just a little brain fog during that week. No residual symptoms. He nods off now in the evening. Just a little more tired.   Mood is doing well. Little stress. Done for the semester with work.   Still feels like he has memory deficit - maternal gm had some dementia. Mother passed in her 41's and no memory issues. Takes longer for short term memory to recall - like tv show from previous night. Harder to retain information after reading. Thinks that it started back in [place] - that was when he started lexapro there.   Last cologuard 09/07/19 negative  Past Medical History:  Diagnosis Date  . Hyperlipidemia   . NSTEMI (non-ST elevated myocardial infarction) ([place])    02/21/19 PCI/DES to 100% mRCA, 65% in pLCX, normal EF   Past Surgical History:  Procedure Laterality Date  . CORONARY STENT INTERVENTION N/A 02/22/2019   Procedure: CORONARY STENT INTERVENTION;  Surgeon: [name], [name], MD;  Location: [facility];  Service: Cardiovascular;  Laterality: N/A;  . KIDNEY SURGERY     ureter surgery to help with drainage  . KNEE ARTHROSCOPY WITH ANTERIOR CRUCIATE LIGAMENT (ACL) REPAIR Left   . LEFT HEART CATH AND CORONARY ANGIOGRAPHY N/A 02/22/2019   Procedure: LEFT  HEART CATH AND CORONARY ANGIOGRAPHY;  Surgeon: [name], [name], MD;  Location: [facility];  Service: Cardiovascular;  Laterality: N/A;   No Known Allergies Current Meds  Medication Sig  . aspirin EC 81 MG tablet Take 81 mg by mouth daily.  Marland Kitchen atorvastatin (LIPITOR) 80 MG tablet TAKE 1 TABLET(80 MG) BY MOUTH DAILY AT 6 PM  . ciclopirox (PENLAC) 8 % solution Apply topically at bedtime. Apply over nail and surrounding skin. Apply daily over previous coat. After seven (7) days, may remove with alcohol and continue cycle.  . Multiple Vitamin (MULTIVITAMIN WITH MINERALS) TABS tablet Take 1 tablet by mouth daily.  . nitroGLYCERIN (NITROSTAT) 0.4 MG SL tablet Place 1 tablet (0.4 mg total) under the tongue every 5 (five) minutes as needed.  . [DISCONTINUED] escitalopram (LEXAPRO) 10 MG tablet TAKE 1/2 TABLET(5 MG) BY MOUTH DAILY   Social History   Tobacco Use  . Smoking status: Never Smoker  . Smokeless tobacco: Never Used  Substance Use Topics  . Alcohol use: Yes    Alcohol/week: 2.0 standard drinks    Types: 2 Glasses of wine per week   Family History  Problem Relation Age of Onset  . Heart attack Brother        10s  . Heart attack Mother 70  . Other Father        didn't know father  . Dementia Maternal Grandmother   . Renal Disease  Brother   . Other Brother        unknown/?overdose     Review of Systems  Constitutional: Negative for activity change, appetite change, chills, fatigue, fever and unexpected weight change.  HENT: Negative for congestion, ear pain, hearing loss, sinus pressure, sinus pain, sore throat and trouble swallowing.   Eyes: Negative for pain and visual disturbance.  Respiratory: Negative for cough, chest tightness, shortness of breath and wheezing.   Cardiovascular: Negative for chest pain, palpitations and leg swelling.  Gastrointestinal: Negative for abdominal distention, abdominal pain, blood in stool, constipation, diarrhea, nausea and vomiting.   Genitourinary: Negative for decreased urine volume, difficulty urinating, dysuria, penile pain and testicular pain.  Musculoskeletal: Negative for arthralgias, back pain and joint swelling.  Skin: Negative for rash.  Neurological: Negative for dizziness, weakness, numbness and headaches.  Hematological: Negative for adenopathy. Does not bruise/bleed easily.  Psychiatric/Behavioral: Negative for agitation, sleep disturbance and suicidal ideas. The patient is not nervous/anxious.     CBC:  Lab Results  Component Value Date   WBC 4.9 02/23/2021   HGB 14.9 02/23/2021   HCT 43.1 02/23/2021   MCH 28.4 02/23/2019   MCHC 34.6 02/23/2021   RDW 13.7 02/23/2021   PLT 172.0 02/23/2021   CMP: Lab Results  Component Value Date   NA 139 02/23/2021   K 4.5 02/23/2021   CL 105 02/23/2021   CO2 29 02/23/2021   ANIONGAP 6 02/23/2019   GLUCOSE 95 02/23/2021   BUN 22 02/23/2021   CREATININE 1.08 02/23/2021   GFRAA >60 02/23/2019   CALCIUM 8.7 02/23/2021   PROT 6.3 02/23/2021   BILITOT 0.7 02/23/2021   ALKPHOS 60 02/23/2021   ALT 35 02/23/2021   AST 25 02/23/2021   LIPID: Lab Results  Component Value Date   CHOL 116 02/23/2021   TRIG 69.0 02/23/2021   HDL 43.90 02/23/2021   LDLCALC 59 02/23/2021    Objective:  BP 100/68 (BP Location: Left Arm, Patient Position: Sitting, Cuff Size: Normal)   Pulse 64   Temp 97.7 F (36.5 C) (Oral)   Ht 5' 9.25" (1.759 m)   Wt 169 lb 11.2 oz (77 kg)   SpO2 95%   BMI 24.88 kg/m   Weight: 169 lb 11.2 oz (77 kg)   BP Readings from Last 3 Encounters:  04/02/21 100/68  03/23/21 100/62  02/20/21 128/71   Wt Readings from Last 3 Encounters:  04/02/21 169 lb 11.2 oz (77 kg)  03/23/21 171 lb 6.4 oz (77.7 kg)  02/20/21 164 lb (74.4 kg)    Physical Exam Constitutional:      General: He is not in acute distress.    Appearance: He is well-developed.  HENT:     Head: Normocephalic and atraumatic.     Right Ear: External ear normal.     Left  Ear: External ear normal.     Nose: Nose normal.     Mouth/Throat:     Pharynx: No oropharyngeal exudate.  Eyes:     Conjunctiva/sclera: Conjunctivae normal.     Pupils: Pupils are equal, round, and reactive to light.  Neck:     Thyroid: No thyromegaly.  Cardiovascular:     Rate and Rhythm: Normal rate and regular rhythm.     Heart sounds: Normal heart sounds. No murmur heard. No friction rub. No gallop.   Pulmonary:     Effort: Pulmonary effort is normal. No respiratory distress.     Breath sounds: Normal breath sounds. No stridor. No wheezing  or rales.  Abdominal:     General: Bowel sounds are normal.     Palpations: Abdomen is soft.  Musculoskeletal:        General: Normal range of motion.     Cervical back: Neck supple.  Skin:    General: Skin is warm and dry.  Neurological:     Mental Status: He is alert and oriented to person, place, and time.  Psychiatric:        Behavior: Behavior normal.        Thought Content: Thought content normal.        Judgment: Judgment normal.     Assessment/Plan: Health Maintenance Due  Topic Date Due  . TETANUS/TDAP  Never done  . COVID-19 Vaccine (4 - Booster for Pfizer series) 01/13/2021   Health Maintenance reviewed - up to date but he will check on tdap. Offered shingrix - interested but will see about tdap and could get together for nurse visit in future.   labwork reviewed by provider - all looked good (from last month)  1. Preventative health care Keep up with regular exercise and healthy eating.   2. Hyperlipidemia, unspecified hyperlipidemia type Well controlled. Continue with lipitor 80mg  daily.   3. Coronary artery disease involving native heart without angina pectoris, unspecified vessel or lesion type Follows with cardiology. Good bp control, lipid control.  4. Memory change Uncertain etiology; he feels better on lexapro- he is going to return to 10mg  dose and see how this does. We discussed further evaluation with  neurocog testing. He will consider.   5. Onychomycosis Let me know if no improvement in 2-3 months; can consider oral tx at that time; but we discussed risks on liver with this. - ciclopirox (PENLAC) 8 % solution; Apply topically at bedtime. Apply over nail and surrounding skin. Apply daily over previous coat. After seven (7) days, may remove with alcohol and continue cycle.  Dispense: 6.6 mL; Refill: 5   Return in about 6 months (around 10/03/2021) for Chronic condition visit.  Micheline Rough, MD

## 2021-04-02 NOTE — Patient Instructions (Addendum)
Can consider memory testing and just let me know if you would like for me to put in a referral for you.   Find out when last Tdap was. You could get this with shingrix if desired.   If you recall dermatologist let me know so we can get records.

## 2021-04-04 ENCOUNTER — Telehealth: Payer: Self-pay | Admitting: *Deleted

## 2021-04-04 NOTE — Telephone Encounter (Signed)
Prior auth for Ciclopirox 8% solution sent to [email or web address]--Key: [id number].

## 2021-04-29 ENCOUNTER — Encounter: Payer: Self-pay | Admitting: Family Medicine

## 2021-04-30 ENCOUNTER — Other Ambulatory Visit: Payer: Self-pay

## 2021-05-01 ENCOUNTER — Encounter: Payer: Self-pay | Admitting: Family Medicine

## 2021-05-01 ENCOUNTER — Ambulatory Visit: Payer: BC Managed Care – PPO | Admitting: Family Medicine

## 2021-05-01 VITALS — BP 120/68 | HR 74 | Temp 97.5°F | Wt 166.5 lb

## 2021-05-01 DIAGNOSIS — L237 Allergic contact dermatitis due to plants, except food: Secondary | ICD-10-CM | POA: Diagnosis not present

## 2021-05-01 MED ORDER — PREDNISONE 10 MG PO TABS: ORAL_TABLET | ORAL | 0 refills | Status: DC

## 2021-05-01 NOTE — Progress Notes (Signed)
Established Patient Office Visit  Subjective:  Patient ID: Alexander Le, male    DOB: [birth year]  Age: 59 y.o. MRN: [id number]  CC:  Chief Complaint  Patient presents with   Poison Ivy    X 1 week, legs, arms chest and neck    HPI Alexander Le presents for severe pruritic blistery rash upper and lower extremities bilaterally.  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  Has never had reactions previously.  Fairly severe pruritus.  He has tried a couple of over-the-counter topicals without relief.  No fever.  Past Medical History:  Diagnosis Date   Hyperlipidemia    NSTEMI (non-ST elevated myocardial infarction) ([place])    02/21/19 PCI/DES to 100% mRCA, 65% in pLCX, normal EF    Past Surgical History:  Procedure Laterality Date   CORONARY STENT INTERVENTION N/A 02/22/2019   Procedure: CORONARY STENT INTERVENTION;  Surgeon: [name], [name], MD;  Location: [facility];  Service: Cardiovascular;  Laterality: N/A;   KIDNEY SURGERY     ureter surgery to help with drainage   KNEE ARTHROSCOPY WITH ANTERIOR CRUCIATE LIGAMENT (ACL) REPAIR Left    LEFT HEART CATH AND CORONARY ANGIOGRAPHY N/A 02/22/2019   Procedure: LEFT HEART CATH AND CORONARY ANGIOGRAPHY;  Surgeon: [name], [name], MD;  Location: [facility];  Service: Cardiovascular;  Laterality: N/A;    Family History  Problem Relation Age of Onset   Heart attack Brother        81s   Heart attack Mother 16   Other Father        didn't know father   Dementia Maternal Grandmother    Renal Disease Brother    Other Brother        unknown/?overdose    Social History   Socioeconomic History   Marital status: Married    Spouse name: Not on file   Number of children: Not on file   Years of education: Not on file   Highest education level: Not on file  Occupational History   Not on file  Tobacco Use   Smoking status: Never   Smokeless tobacco: Never  Substance and Sexual Activity    Alcohol use: Yes    Alcohol/week: 2.0 standard drinks    Types: 2 Glasses of wine per week   Drug use: Never   Sexual activity: Not on file  Other Topics Concern   Not on file  Social History Narrative   Not on file   Social Determinants of Health   Financial Resource Strain: Not on file  Food Insecurity: Not on file  Transportation Needs: Not on file  Physical Activity: Not on file  Stress: Not on file  Social Connections: Not on file  Intimate Partner Violence: Not on file    Outpatient Medications Prior to Visit  Medication Sig Dispense Refill   aspirin EC 81 MG tablet Take 81 mg by mouth daily.     atorvastatin (LIPITOR) 80 MG tablet TAKE 1 TABLET(80 MG) BY MOUTH DAILY AT 6 PM 90 tablet 3   ciclopirox (PENLAC) 8 % solution Apply topically at bedtime. Apply over nail and surrounding skin. Apply daily over previous coat. After seven (7) days, may remove with alcohol and continue cycle. 6.6 mL 5   escitalopram (LEXAPRO) 10 MG tablet Take 1 tablet (10 mg total) by mouth daily. 90 tablet 1   Multiple Vitamin (MULTIVITAMIN WITH MINERALS) TABS tablet Take 1 tablet by mouth  daily.     nitroGLYCERIN (NITROSTAT) 0.4 MG SL tablet Place 1 tablet (0.4 mg total) under the tongue every 5 (five) minutes as needed. 25 tablet 2   No facility-administered medications prior to visit.    No Known Allergies  ROS Review of Systems  Constitutional:  Negative for chills and fever.  Skin:  Positive for rash.     Objective:    Physical Exam Vitals reviewed.  Constitutional:      Appearance: Normal appearance.  Cardiovascular:     Rate and Rhythm: Normal rate and regular rhythm.  Skin:    Findings: Rash present.     Comments: He has raised vesicular rash several patches especially involving the left knee and leg but bilateral legs and bilateral arms.  Some of these are in linear distribution  Neurological:     Mental Status: He is alert.    BP 120/68 (BP Location: Left Arm, Patient  Position: Sitting, Cuff Size: Normal)   Pulse 74   Temp (!) 97.5 F (36.4 C) (Oral)   Wt 166 lb 8 oz (75.5 kg)   SpO2 96%   BMI 24.41 kg/m  Wt Readings from Last 3 Encounters:  05/01/21 166 lb 8 oz (75.5 kg)  04/02/21 169 lb 11.2 oz (77 kg)  03/23/21 171 lb 6.4 oz (77.7 kg)     Health Maintenance Due  Topic Date Due   Pneumococcal Vaccine 23-27 Years old (1 - PCV) Never done   TETANUS/TDAP  Never done   Zoster Vaccines- Shingrix (1 of 2) Never done   COVID-19 Vaccine (4 - Booster for Pfizer series) 01/13/2021    There are no preventive care reminders to display for this patient.  Lab Results  Component Value Date   TSH 2.13 02/23/2021   Lab Results  Component Value Date   WBC 4.9 02/23/2021   HGB 14.9 02/23/2021   HCT 43.1 02/23/2021   MCV 85.8 02/23/2021   PLT 172.0 02/23/2021   Lab Results  Component Value Date   NA 139 02/23/2021   K 4.5 02/23/2021   CO2 29 02/23/2021   GLUCOSE 95 02/23/2021   BUN 22 02/23/2021   CREATININE 1.08 02/23/2021   BILITOT 0.7 02/23/2021   ALKPHOS 60 02/23/2021   AST 25 02/23/2021   ALT 35 02/23/2021   PROT 6.3 02/23/2021   ALBUMIN 4.0 02/23/2021   CALCIUM 8.7 02/23/2021   ANIONGAP 6 02/23/2019   GFR 75.68 02/23/2021   Lab Results  Component Value Date   CHOL 116 02/23/2021   Lab Results  Component Value Date   HDL 43.90 02/23/2021   Lab Results  Component Value Date   LDLCALC 59 02/23/2021   Lab Results  Component Value Date   TRIG 69.0 02/23/2021   Lab Results  Component Value Date   CHOLHDL 3 02/23/2021   Lab Results  Component Value Date   HGBA1C 5.2 02/23/2021      Assessment & Plan:   Problem List Items Addressed This Visit   None Visit Diagnoses     Allergic contact dermatitis due to plants, except food    -  Primary     Allergic contact dermatitis -Recommend trial of prednisone taper.  Reviewed potential side effects of prednisone. -Keep clean with soap and water -Follow-up for any  persistent or worsening rash  Meds ordered this encounter  Medications   predniSONE (DELTASONE) 10 MG tablet    Sig: Taper as follows: 6-6-6-4-4-4-3-3-2-2-1-1    Dispense:  42 tablet  Refill:  0    Follow-up: No follow-ups on file.    Carolann Littler, MD

## 2021-05-16 ENCOUNTER — Telehealth: Payer: Self-pay

## 2021-05-16 NOTE — Telephone Encounter (Signed)
PA sent Key# [id number]

## 2021-05-17 ENCOUNTER — Other Ambulatory Visit: Payer: Self-pay | Admitting: Physician Assistant

## 2021-05-17 NOTE — Telephone Encounter (Signed)
Rx(s) sent to pharmacy electronically.  

## 2021-05-17 NOTE — Telephone Encounter (Signed)
PA has been denied.  

## 2021-05-23 NOTE — Telephone Encounter (Signed)
What is this PA for? I don't see anything scanned in media and not sure where else to look?

## 2021-05-23 NOTE — Telephone Encounter (Signed)
Fax received from [facility] stating the request for Ciclopirox was denied.  PCP reviewed the fax and stated to let the pt know this information and to try an OTC medication to see if this helps.  Patient was informed and agreed to try an OTC med, the fax was sent to be scanned.

## 2021-10-03 ENCOUNTER — Encounter: Payer: Self-pay | Admitting: Family Medicine

## 2021-10-03 ENCOUNTER — Ambulatory Visit: Payer: BC Managed Care – PPO | Admitting: Family Medicine

## 2021-10-03 VITALS — BP 100/62 | HR 62 | Temp 97.5°F | Ht 69.5 in | Wt 168.5 lb

## 2021-10-03 DIAGNOSIS — I2510 Atherosclerotic heart disease of native coronary artery without angina pectoris: Secondary | ICD-10-CM

## 2021-10-03 DIAGNOSIS — E785 Hyperlipidemia, unspecified: Secondary | ICD-10-CM | POA: Diagnosis not present

## 2021-10-03 DIAGNOSIS — R748 Abnormal levels of other serum enzymes: Secondary | ICD-10-CM

## 2021-10-03 DIAGNOSIS — Z23 Encounter for immunization: Secondary | ICD-10-CM | POA: Diagnosis not present

## 2021-10-03 LAB — LIPID PANEL
Cholesterol: 137 mg/dL (ref 0–200)
HDL: 49 mg/dL (ref >39.00)
LDL Cholesterol: 67 mg/dL (ref 0–99)
NonHDL: 87.94
Total CHOL/HDL Ratio: 3
Triglycerides: 107 mg/dL (ref 0.0–149.0)
VLDL: 21.4 mg/dL (ref 0.0–40.0)

## 2021-10-03 LAB — COMPREHENSIVE METABOLIC PANEL
ALT: 63 U/L — ABNORMAL HIGH (ref 0–53)
AST: 167 U/L — ABNORMAL HIGH (ref 0–37)
Albumin: 4.4 g/dL (ref 3.5–5.2)
Alkaline Phosphatase: 59 U/L (ref 39–117)
BUN: 21 mg/dL (ref 6–23)
CO2: 29 mEq/L (ref 19–32)
Calcium: 8.9 mg/dL (ref 8.4–10.5)
Chloride: 105 mEq/L (ref 96–112)
Creatinine, Ser: 1.05 mg/dL (ref 0.40–1.50)
GFR: 77.95 mL/min (ref >60.00)
Glucose, Bld: 93 mg/dL (ref 70–99)
Potassium: 4.3 mEq/L (ref 3.5–5.1)
Sodium: 140 mEq/L (ref 135–145)
Total Bilirubin: 0.8 mg/dL (ref 0.2–1.2)
Total Protein: 7.1 g/dL (ref 6.0–8.3)

## 2021-10-03 NOTE — Progress Notes (Signed)
Alexander Le DOB: June 20, 1961 Encounter date: 10/03/2021  This is a 60 y.o. male who presents for complete physical   History of present illness/Additional concerns:  Blood pressure staying low - checks every day. Goes to [facility] 3 days/week. Tues/thurs when running also checks. 90/60's generally. Runs 4 miles at a time. Just feels a little tired. Just feels like he needs a nap sometimes. Heart rate similar to today's reading. Even after running HR about 108/110.  Sleeps well. Falls asleep quickly. Sometimes not rested when he gets up in the morning. Thinks he snores, but not bad enough to bother others.   Mood has been positive. Has tried to cut back lexapro, but 10mg  does better.   HL: lipitor 80mg , tolerates well.   Past Medical History:  Diagnosis Date   Hyperlipidemia    NSTEMI (non-ST elevated myocardial infarction) ([place])    02/21/19 PCI/DES to 100% mRCA, 65% in pLCX, normal EF   Past Surgical History:  Procedure Laterality Date   CORONARY STENT INTERVENTION N/A 02/22/2019   Procedure: CORONARY STENT INTERVENTION;  Surgeon: [name], [name], MD;  Location: [facility];  Service: Cardiovascular;  Laterality: N/A;   KIDNEY SURGERY     ureter surgery to help with drainage   KNEE ARTHROSCOPY WITH ANTERIOR CRUCIATE LIGAMENT (ACL) REPAIR Left    LEFT HEART CATH AND CORONARY ANGIOGRAPHY N/A 02/22/2019   Procedure: LEFT HEART CATH AND CORONARY ANGIOGRAPHY;  Surgeon: [name], [name], MD;  Location: [facility];  Service: Cardiovascular;  Laterality: N/A;   No Known Allergies Current Meds  Medication Sig   aspirin EC 81 MG tablet Take 81 mg by mouth daily.   atorvastatin (LIPITOR) 80 MG tablet TAKE 1 TABLET(80 MG) BY MOUTH DAILY AT 6 PM   escitalopram (LEXAPRO) 10 MG tablet Take 1 tablet (10 mg total) by mouth daily.   Multiple Vitamin (MULTIVITAMIN WITH MINERALS) TABS tablet Take 1 tablet by mouth daily.   nitroGLYCERIN (NITROSTAT) 0.4 MG SL tablet Place 1 tablet (0.4 mg  total) under the tongue every 5 (five) minutes as needed.   Social History   Tobacco Use   Smoking status: Never   Smokeless tobacco: Never  Substance Use Topics   Alcohol use: Yes    Alcohol/week: 2.0 standard drinks    Types: 2 Glasses of wine per week   Family History  Problem Relation Age of Onset   Heart attack Brother        71s   Heart attack Mother 68   Other Father        didn't know father   Dementia Maternal Grandmother    Renal Disease Brother    Other Brother        unknown/?overdose     Review of Systems  Constitutional:  Positive for fatigue (mild; doesn't interfere with activity; just notes he will nap during day with working at home). Negative for chills and fever.  Respiratory:  Negative for cough, chest tightness, shortness of breath and wheezing.   Cardiovascular:  Negative for chest pain, palpitations and leg swelling.   CBC:  Lab Results  Component Value Date   WBC 4.9 02/23/2021   HGB 14.9 02/23/2021   HCT 43.1 02/23/2021   MCH 28.4 02/23/2019   MCHC 34.6 02/23/2021   RDW 13.7 02/23/2021   PLT 172.0 02/23/2021   CMP: Lab Results  Component Value Date   NA 140 10/03/2021   K 4.3 10/03/2021   CL 105 10/03/2021  CO2 29 10/03/2021   ANIONGAP 6 02/23/2019   GLUCOSE 93 10/03/2021   BUN 21 10/03/2021   CREATININE 1.05 10/03/2021   GFRAA >60 02/23/2019   CALCIUM 8.9 10/03/2021   PROT 7.1 10/03/2021   BILITOT 0.8 10/03/2021   ALKPHOS 59 10/03/2021   ALT 63 (H) 10/03/2021   AST 167 (H) 10/03/2021   LIPID: Lab Results  Component Value Date   CHOL 137 10/03/2021   TRIG 107.0 10/03/2021   HDL 49.00 10/03/2021   LDLCALC 67 10/03/2021    Objective:  BP 100/62 (BP Location: Left Arm, Patient Position: Sitting, Cuff Size: Normal)   Pulse 62   Temp (!) 97.5 F (36.4 C) (Oral)   Ht 5' 9.5" (1.765 m)   Wt 168 lb 8 oz (76.4 kg)   SpO2 98%   BMI 24.53 kg/m   Weight: 168 lb 8 oz (76.4 kg)   BP Readings from Last 3 Encounters:   10/03/21 100/62  05/01/21 120/68  04/02/21 100/68   Wt Readings from Last 3 Encounters:  10/03/21 168 lb 8 oz (76.4 kg)  05/01/21 166 lb 8 oz (75.5 kg)  04/02/21 169 lb 11.2 oz (77 kg)    Physical Exam Constitutional:      General: He is not in acute distress.    Appearance: He is well-developed.  Cardiovascular:     Rate and Rhythm: Normal rate and regular rhythm.     Heart sounds: Normal heart sounds. No murmur heard.   No friction rub.  Pulmonary:     Effort: Pulmonary effort is normal. No respiratory distress.     Breath sounds: Normal breath sounds. No wheezing or rales.  Musculoskeletal:     Right lower leg: No edema.     Left lower leg: No edema.  Neurological:     Mental Status: He is alert and oriented to person, place, and time.  Psychiatric:        Behavior: Behavior normal.    Assessment/Plan: Health Maintenance Due  Topic Date Due   TETANUS/TDAP  Never done   Zoster Vaccines- Shingrix (1 of 2) Never done   Health Maintenance reviewed.  1. Hyperlipidemia, unspecified hyperlipidemia type Has been well controlled on lipitor 80mg  daily.  - Lipid panel; Future - Comprehensive metabolic panel; Future - Comprehensive metabolic panel - Lipid panel  2. Coronary artery disease involving native heart without angina pectoris, unspecified vessel or lesion type Following with cardiology; bp and cholesterol is well controlled.   3. Need for immunization against influenza  4. Need for Tdap vaccination - Flu Vaccine QUAD 6+ mos PF IM (Fluarix Quad PF)  5. Need for prophylactic vaccination against Streptococcus pneumoniae (pneumococcus) - Pneumococcal conjugate vaccine 20-valent (Prevnar 20)  Return in about 6 months (around 04/02/2022) for physical exam.  Micheline Rough, MD

## 2021-10-08 NOTE — Addendum Note (Signed)
Addended by: Agnes Lawrence on: 10/08/2021 11:12 AM   Modules accepted: Orders

## 2021-10-10 ENCOUNTER — Other Ambulatory Visit (INDEPENDENT_AMBULATORY_CARE_PROVIDER_SITE_OTHER): Payer: BC Managed Care – PPO

## 2021-10-10 DIAGNOSIS — R748 Abnormal levels of other serum enzymes: Secondary | ICD-10-CM

## 2021-10-10 LAB — HEPATIC FUNCTION PANEL
ALT: 42 U/L (ref 0–53)
AST: 30 U/L (ref 0–37)
Albumin: 4 g/dL (ref 3.5–5.2)
Alkaline Phosphatase: 56 U/L (ref 39–117)
Bilirubin, Direct: 0.1 mg/dL (ref 0.0–0.3)
Total Bilirubin: 0.5 mg/dL (ref 0.2–1.2)
Total Protein: 6.7 g/dL (ref 6.0–8.3)

## 2021-10-14 ENCOUNTER — Other Ambulatory Visit: Payer: Self-pay | Admitting: Family Medicine

## 2022-01-27 ENCOUNTER — Emergency Department (HOSPITAL_BASED_OUTPATIENT_CLINIC_OR_DEPARTMENT_OTHER): Payer: BC Managed Care – PPO

## 2022-01-27 ENCOUNTER — Emergency Department (HOSPITAL_BASED_OUTPATIENT_CLINIC_OR_DEPARTMENT_OTHER)
Admission: EM | Admit: 2022-01-27 | Discharge: 2022-01-27 | Disposition: A | Discharge status: Home / Self Care | Payer: BC Managed Care – PPO | Attending: Emergency Medicine | Admitting: Emergency Medicine

## 2022-01-27 ENCOUNTER — Other Ambulatory Visit: Payer: Self-pay

## 2022-01-27 ENCOUNTER — Encounter (HOSPITAL_BASED_OUTPATIENT_CLINIC_OR_DEPARTMENT_OTHER): Payer: Self-pay | Admitting: Emergency Medicine

## 2022-01-27 DIAGNOSIS — R110 Nausea: Secondary | ICD-10-CM | POA: Diagnosis not present

## 2022-01-27 DIAGNOSIS — R531 Weakness: Secondary | ICD-10-CM | POA: Diagnosis not present

## 2022-01-27 DIAGNOSIS — Z7982 Long term (current) use of aspirin: Secondary | ICD-10-CM | POA: Diagnosis not present

## 2022-01-27 DIAGNOSIS — R002 Palpitations: Secondary | ICD-10-CM | POA: Diagnosis not present

## 2022-01-27 DIAGNOSIS — I2510 Atherosclerotic heart disease of native coronary artery without angina pectoris: Secondary | ICD-10-CM | POA: Diagnosis not present

## 2022-01-27 DIAGNOSIS — R072 Precordial pain: Secondary | ICD-10-CM | POA: Diagnosis present

## 2022-01-27 DIAGNOSIS — R0789 Other chest pain: Secondary | ICD-10-CM | POA: Diagnosis not present

## 2022-01-27 DIAGNOSIS — Z20822 Contact with and (suspected) exposure to covid-19: Secondary | ICD-10-CM | POA: Diagnosis not present

## 2022-01-27 LAB — CBC WITH DIFFERENTIAL/PLATELET
Abs Immature Granulocytes: 0.01 10*3/uL (ref 0.00–0.07)
Basophils Absolute: 0.1 10*3/uL (ref 0.0–0.1)
Basophils Relative: 1 %
Eosinophils Absolute: 0.3 10*3/uL (ref 0.0–0.5)
Eosinophils Relative: 5 %
HCT: 41.9 % (ref 39.0–52.0)
Hemoglobin: 14.1 g/dL (ref 13.0–17.0)
Immature Granulocytes: 0 %
Lymphocytes Relative: 34 %
Lymphs Abs: 1.8 10*3/uL (ref 0.7–4.0)
MCH: 29 pg (ref 26.0–34.0)
MCHC: 33.7 g/dL (ref 30.0–36.0)
MCV: 86 fL (ref 80.0–100.0)
Monocytes Absolute: 0.4 10*3/uL (ref 0.1–1.0)
Monocytes Relative: 8 %
Neutro Abs: 2.9 10*3/uL (ref 1.7–7.7)
Neutrophils Relative %: 52 %
Platelets: 172 10*3/uL (ref 150–400)
RBC: 4.87 MIL/uL (ref 4.22–5.81)
RDW: 13.1 % (ref 11.5–15.5)
WBC: 5.5 10*3/uL (ref 4.0–10.5)
nRBC: 0 % (ref 0.0–0.2)

## 2022-01-27 LAB — RESP PANEL BY RT-PCR (FLU A&B, COVID) ARPGX2
Influenza A by PCR: NEGATIVE
Influenza B by PCR: NEGATIVE
SARS Coronavirus 2 by RT PCR: NEGATIVE

## 2022-01-27 LAB — COMPREHENSIVE METABOLIC PANEL
ALT: 33 U/L (ref 0–44)
AST: 27 U/L (ref 15–41)
Albumin: 4.3 g/dL (ref 3.5–5.0)
Alkaline Phosphatase: 47 U/L (ref 38–126)
Anion gap: 6 (ref 5–15)
BUN: 23 mg/dL — ABNORMAL HIGH (ref 6–20)
CO2: 28 mmol/L (ref 22–32)
Calcium: 9.1 mg/dL (ref 8.9–10.3)
Chloride: 106 mmol/L (ref 98–111)
Creatinine, Ser: 0.96 mg/dL (ref 0.61–1.24)
GFR, Estimated: >60 mL/min (ref >60)
Glucose, Bld: 97 mg/dL (ref 70–99)
Potassium: 4.2 mmol/L (ref 3.5–5.1)
Sodium: 140 mmol/L (ref 135–145)
Total Bilirubin: 0.6 mg/dL (ref 0.3–1.2)
Total Protein: 7 g/dL (ref 6.5–8.1)

## 2022-01-27 LAB — TROPONIN I (HIGH SENSITIVITY)
Troponin I (High Sensitivity): <2 ng/L (ref <18)
Troponin I (High Sensitivity): <2 ng/L (ref <18)

## 2022-01-27 LAB — LIPASE, BLOOD: Lipase: 61 U/L — ABNORMAL HIGH (ref 11–51)

## 2022-01-27 NOTE — ED Provider Notes (Signed)
If trop no change, D/C for f/u outpatient. ?Physical Exam  ?BP 109/74   Pulse (!) 54   Temp 97.6 ?F (36.4 ?C)   Resp 16   Ht '5\' 9"'$  (1.753 m)   Wt 74.8 kg   SpO2 100%   BMI 24.37 kg/m?  ? ?Physical Exam ? ?Procedures  ?Procedures ? ?ED Course / MDM  ?  ?Medical Decision Making ?Amount and/or Complexity of Data Reviewed ?Labs: ordered. ?Radiology: ordered. ? ? ? ? ? ? ? ?  ?Charlesetta Shanks, MD ?01/27/22 1510 ? ?

## 2022-01-27 NOTE — Discharge Instructions (Addendum)
1.  Follow-up with your cardiologist as soon as possible. ?2.  Return to the emergency department if you have new worsening or concerning symptoms. ?

## 2022-01-27 NOTE — ED Provider Notes (Signed)
?[facility] ?Provider Note ? ? ?CSN: [id number] ?Arrival date & time: 01/27/22  1309 ? ?  ? ?History ? ?Chief Complaint  ?Patient presents with  ? Chest Pain  ? ? ?Alexander Le is a 61 y.o. male. ? ?The history is provided by the patient.  ?Chest Pain ?Pain location:  Substernal area ?Pain severity:  Mild ?Onset quality:  Sudden ?Duration:  1 hour ?Timing:  Intermittent ?Chronicity:  New ?Relieved by:  Nitroglycerin ?Worsened by:  Nothing ?Associated symptoms: nausea and weakness   ?Associated symptoms: no abdominal pain, no anorexia, no anxiety, no back pain, no claudication, no cough, no diaphoresis, no dizziness, no dysphagia, no fatigue, no fever, no headache, no heartburn, no lower extremity edema, no near-syncope, no numbness, no orthopnea, no palpitations and no PND   ?Risk factors: coronary artery disease   ? ?  ? ?Home Medications ?Prior to Admission medications   ?Medication Sig Start Date End Date Taking? Authorizing Provider  ?aspirin EC 81 MG tablet Take 81 mg by mouth daily.    [provider]  ?atorvastatin (LIPITOR) 80 MG tablet TAKE 1 TABLET(80 MG) BY MOUTH DAILY AT 6 PM 05/17/21   [name], MD  ?escitalopram (LEXAPRO) 10 MG tablet TAKE 1 TABLET(10 MG) BY MOUTH DAILY 10/14/21   Caren Macadam, MD  ?Multiple Vitamin (MULTIVITAMIN WITH MINERALS) TABS tablet Take 1 tablet by mouth daily.    [provider]  ?nitroGLYCERIN (NITROSTAT) 0.4 MG SL tablet Place 1 tablet (0.4 mg total) under the tongue every 5 (five) minutes as needed. 02/19/21   [name], MD  ?   ? ?Allergies    ?Patient has no known allergies.   ? ?Review of Systems   ?Review of Systems  ?Constitutional:  Negative for diaphoresis, fatigue and fever.  ?HENT:  Negative for trouble swallowing.   ?Respiratory:  Negative for cough.   ?Cardiovascular:  Positive for chest pain. Negative for palpitations, orthopnea, claudication, PND and near-syncope.  ?Gastrointestinal:  Positive  for nausea. Negative for abdominal pain, anorexia and heartburn.  ?Musculoskeletal:  Negative for back pain.  ?Neurological:  Positive for weakness. Negative for dizziness, numbness and headaches.  ? ?Physical Exam ?Updated Vital Signs ?BP 109/74   Pulse (!) 54   Temp 97.6 ?F (36.4 ?C)   Resp 16   Ht '5\' 9"'$  (1.753 m)   Wt 74.8 kg   SpO2 100%   BMI 24.37 kg/m?  ?Physical Exam ?Vitals and nursing note reviewed.  ?Constitutional:   ?   General: He is not in acute distress. ?   Appearance: He is well-developed. He is not ill-appearing.  ?HENT:  ?   Head: Normocephalic and atraumatic.  ?Eyes:  ?   Extraocular Movements: Extraocular movements intact.  ?   Conjunctiva/sclera: Conjunctivae normal.  ?   Pupils: Pupils are equal, round, and reactive to light.  ?Cardiovascular:  ?   Rate and Rhythm: Normal rate and regular rhythm.  ?   Pulses:     ?     Radial pulses are 2+ on the right side and 2+ on the left side.  ?   Heart sounds: Normal heart sounds. No murmur heard. ?Pulmonary:  ?   Effort: Pulmonary effort is normal. No respiratory distress.  ?   Breath sounds: Normal breath sounds. No decreased breath sounds, wheezing, rhonchi or rales.  ?Abdominal:  ?   Palpations: Abdomen is soft.  ?   Tenderness: There is no abdominal tenderness.  ?  Musculoskeletal:     ?   General: No swelling.  ?   Cervical back: Normal range of motion and neck supple.  ?   Right lower leg: No edema.  ?   Left lower leg: No edema.  ?Skin: ?   General: Skin is warm and dry.  ?   Capillary Refill: Capillary refill takes less than 2 seconds.  ?Neurological:  ?   General: No focal deficit present.  ?   Mental Status: He is alert.  ?Psychiatric:     ?   Mood and Affect: Mood normal.  ? ? ?ED Results / Procedures / Treatments   ?Labs ?(all labs ordered are listed, but only abnormal results are displayed) ?Labs Reviewed  ?COMPREHENSIVE METABOLIC PANEL - Abnormal; Notable for the following components:  ?    Result Value  ? BUN 23 (*)   ? All other  components within normal limits  ?LIPASE, BLOOD - Abnormal; Notable for the following components:  ? Lipase 61 (*)   ? All other components within normal limits  ?RESP PANEL BY RT-PCR (FLU A&B, COVID) [id number]  ?CBC WITH DIFFERENTIAL/PLATELET  ?TROPONIN I (HIGH SENSITIVITY)  ?TROPONIN I (HIGH SENSITIVITY)  ? ? ?EKG ?EKG Interpretation ? ?Date/Time:  Sunday January 27 2022 13:18:00 EDT ?Ventricular Rate:  61 ?PR Interval:  144 ?QRS Duration: 84 ?QT Interval:  408 ?QTC Calculation: 410 ?R Axis:   66 ?Text Interpretation: Normal sinus rhythm Normal ECG When compared with ECG of 23-Feb-2019 05:46, Criteria for Inferior infarct are no longer Present T wave inversion no longer evident in Inferior leads Nonspecific T wave abnormality no longer evident in Lateral leads Confirmed by Lennice Sites ([id number]) on 01/27/2022 1:19:24 PM ? ?Radiology ?DG Chest Portable 1 View ? ?Result Date: 01/27/2022 ?CLINICAL DATA:  Chest pain, shortness of breath EXAM: PORTABLE CHEST 1 VIEW COMPARISON:  02/20/2019 FINDINGS: The heart size and mediastinal contours are within normal limits. Both lungs are clear. The visualized skeletal structures are unremarkable. IMPRESSION: No acute abnormality of the lungs in AP portable projection. Electronically Signed   By: Delanna Ahmadi M.D.   On: 01/27/2022 14:06   ? ?Procedures ?Procedures  ? ? ?Medications Ordered in ED ?Medications - No data to display ? ?ED Course/ Medical Decision Making/ A&P ?  ?                        ?Medical Decision Making ?Amount and/or Complexity of Data Reviewed ?Labs: ordered. ?Radiology: ordered. ? ? ?Alexander Le is here with chest discomfort.  Normal vitals.  No fever.  States while doing some yard work yesterday he had some substernal chest pain that resolved did not take any nitroglycerin.  Today while shopping about an hour ago he started to feel kind of weak, discomfort in the middle of his chest upper abdomen and nausea and lightheaded.  Similar to how he felt with MI that  he had several years ago.  Took nitroglycerin and symptoms appeared to help.  He is fairly active and runs several days a week without any discomfort.  States when he had his prior heart attack he was fairly active as well and did not have any chest pain episodes before he had his stent placed.  Has significant family history of cardiac disease.  He is on aspirin.  EKG per my review and interpretation shows sinus rhythm.  No obvious ischemic changes.  Differential diagnosis includes acute coronary syndrome, seems less likely to  be pneumonia, anemia.  Wells criteria 0 and doubt PE.  History and physical not consistent with dissection.  We will get CBC, CMP, lipase, troponin, chest x-ray, COVID test.  Will talk with cardiology following trop. ? ?Lab work has been reviewed and interpreted by myself.  No significant anemia, electrolyte abnormality, kidney injury.  Troponin undetectable.  Chest x-ray per my review and interpretation shows no pneumonia or pneumothorax.  I have no concern for PE or other pulmonary process.  Talked with Dr. Harrell Gave with cardiology who recommends repeating troponin and if unremarkable can follow-up outpatient.  Overall I suspect possibly PVCs causing his symptoms.  Please see oncoming ED provider's note for further results, evaluation, disposition of the patient. ? ?This chart was dictated using voice recognition software.  Despite best efforts to proofread,  errors can occur which can change the documentation meaning.  ? ? ? ? ? ? ? ?Final Clinical Impression(s) / ED Diagnoses ?Final diagnoses:  ?Palpitations  ?Atypical chest pain  ? ? ?Rx / DC Orders ?ED Discharge Orders   ? ? None  ? ?  ? ? ?  ?Lennice Sites, DO ?01/27/22 1510 ? ?

## 2022-01-27 NOTE — ED Triage Notes (Signed)
Pt c/o heart palpitations with right arm numbness and tingling and shortness of breath x 30 minutes. Pt took 4 nitroglycerin with some relief. ?

## 2022-01-28 ENCOUNTER — Telehealth: Payer: Self-pay | Admitting: Family Medicine

## 2022-01-28 NOTE — Telephone Encounter (Signed)
Pt went to [facility] [place] yesterday with heart palps and dizziness and does have an appt with cardiologist in April. I did sch pt appt with dr Ethlyn Gallery on 02-04-2022 however pt wife would like a sooner appt with dr Ethlyn Gallery. Pt is stable. Please advise ?

## 2022-01-28 NOTE — Telephone Encounter (Signed)
Yes that's fine 

## 2022-01-28 NOTE — Telephone Encounter (Signed)
He can be offered a 12:00 on Wednesday time slot or same day on Friday. ?

## 2022-01-29 ENCOUNTER — Encounter: Payer: Self-pay | Admitting: Cardiology

## 2022-01-29 ENCOUNTER — Encounter: Payer: Self-pay | Admitting: Family Medicine

## 2022-01-29 NOTE — Telephone Encounter (Signed)
Spoke with the patient and offered appts as below.  Per patient's preference, an appt was scheduled for 3/17 at 3pm.  ?

## 2022-02-01 ENCOUNTER — Ambulatory Visit: Payer: BC Managed Care – PPO | Admitting: Family Medicine

## 2022-02-01 ENCOUNTER — Encounter: Payer: Self-pay | Admitting: Family Medicine

## 2022-02-01 VITALS — BP 102/74 | HR 52 | Temp 97.5°F | Ht 69.0 in | Wt 173.2 lb

## 2022-02-01 DIAGNOSIS — R0789 Other chest pain: Secondary | ICD-10-CM | POA: Diagnosis not present

## 2022-02-01 DIAGNOSIS — M25521 Pain in right elbow: Secondary | ICD-10-CM

## 2022-02-01 NOTE — Progress Notes (Signed)
?Alexander Le ?DOB: 07/03/1961 ?Encounter date: 02/01/2022 ? ?This is a 61 y.o. male who presents with ?Chief Complaint  ?Patient presents with  ? Hospitalization Follow-up  ? ? ?History of present illness: ?Patient went to ER on 01/27/22 with sx of mild substernal sudden chest pain,relieved by nitro. Associated with nausea, weakness. Felt similar to prior MI sx. EKG in ER no obvious ischemic changes. Troponin negative.  ? ?Sunday was going to [employer] - nausea, felt like eyes were closing on him. Has had some soreness of right arm for 6 weeks. Has laid off weight lifting. Had some slight pressure on chest. Took nitro, drove home. Started back after he got home. (9 minutes later); happened again at hospital. No vomiting. Nothing since leaving hospital.  ? ?Had cup of coffee before leaving house.  ? ?Had run 4 miles Friday before. Doing 20-30 minutes of weights.  ? ?Gripping with right hand, even pot is sore in the joint. Won't go away. Started after doing tricep push downs. Has decreased to 2x/week down from 2 hours at gym. Pain started a few days after lifting. Very slight if just resting arm.  ? ?Saturday was out raking and had some chest discomfort. He stated it hurt after, not during activity.  ? ? ?No Known Allergies ?Current Meds  ?Medication Sig  ? aspirin EC 81 MG tablet Take 81 mg by mouth daily.  ? atorvastatin (LIPITOR) 80 MG tablet TAKE 1 TABLET(80 MG) BY MOUTH DAILY AT 6 PM  ? escitalopram (LEXAPRO) 10 MG tablet TAKE 1 TABLET(10 MG) BY MOUTH DAILY  ? Multiple Vitamin (MULTIVITAMIN WITH MINERALS) TABS tablet Take 1 tablet by mouth daily.  ? nitroGLYCERIN (NITROSTAT) 0.4 MG SL tablet Place 1 tablet (0.4 mg total) under the tongue every 5 (five) minutes as needed.  ? ? ?Review of Systems  ?Constitutional:  Negative for chills, fatigue and fever.  ?Respiratory:  Negative for cough, chest tightness, shortness of breath and wheezing.   ?Cardiovascular:  Negative for chest pain, palpitations and leg swelling.   ? ?Objective: ? ?There were no vitals taken for this visit.     ? ?BP Readings from Last 3 Encounters:  ?01/27/22 110/77  ?10/03/21 100/62  ?05/01/21 120/68  ? ?Wt Readings from Last 3 Encounters:  ?01/27/22 165 lb (74.8 kg)  ?10/03/21 168 lb 8 oz (76.4 kg)  ?05/01/21 166 lb 8 oz (75.5 kg)  ? ? ?Physical Exam ?Constitutional:   ?   General: He is not in acute distress. ?   Appearance: He is well-developed.  ?Cardiovascular:  ?   Rate and Rhythm: Normal rate and regular rhythm.  ?   Heart sounds: Normal heart sounds. No murmur heard. ?  No friction rub.  ?Pulmonary:  ?   Effort: Pulmonary effort is normal. No respiratory distress.  ?   Breath sounds: Normal breath sounds. No wheezing or rales.  ?Musculoskeletal:  ?   Right lower leg: No edema.  ?   Left lower leg: No edema.  ?Neurological:  ?   Mental Status: He is alert and oriented to person, place, and time.  ?Psychiatric:     ?   Behavior: Behavior normal.  ? ? ?Assessment/Plan ?1. Chest pressure ?I do feel that he needs to follow-up with cardiology.  It worries me that symptoms felt similar to him as they did when he was having previous heart attack.  I am reassured by normal troponins from the emergency room.  I did encouraged him to seek  evaluation if symptoms return.  Otherwise he is on cancellation list for cardiology and will follow up with them next month. ? ?2. Right elbow pain ?We discussed icing, stretching exercises, tennis elbow band.  I worry that his pain may be more at the biceps insertion site, but I feel that this will also respond well to stretching icing and rest.  I am going to go ahead and refer to Ortho as he does like to exercise regularly and wants to get back to regular exercises soon as possible. ?- Ambulatory referral to Orthopedics ? ? ?Return if symptoms worsen or fail to improve. ? ? ? ? ?Micheline Rough, MD ?

## 2022-02-01 NOTE — Patient Instructions (Addendum)
*  ice elbow 20 minutes twice daily.  ?*gentle arm stretches, try to avoid lifting/twisting for a couple of weeks.  ?

## 2022-02-04 ENCOUNTER — Inpatient Hospital Stay: Payer: BC Managed Care – PPO | Admitting: Family Medicine

## 2022-03-07 NOTE — Progress Notes (Signed)
? ? ?Office Visit  ?  ?Patient Name: Alexander Le ?Date of Encounter: 03/08/2022 ? ?Primary Care Provider:  Caren Macadam, MD ?Primary Cardiologist:  Kirk Ruths, MD ? ?Chief Complaint  ?  ?61 year old male with a history of CAD, hypertension, and hyperlipidemia who presents for follow-up related to CAD. ? ?Past Medical History  ?  ?Past Medical History:  ?Diagnosis Date  ? Hyperlipidemia   ? NSTEMI (non-ST elevated myocardial infarction) ([facility])   ? 02/21/19 PCI/DES to 100% mRCA, 65% in pLCX, normal EF  ? ?Past Surgical History:  ?Procedure Laterality Date  ? CORONARY STENT INTERVENTION N/A 02/22/2019  ? Procedure: CORONARY STENT INTERVENTION;  Surgeon: [name], [name], MD;  Location: [facility];  Service: Cardiovascular;  Laterality: N/A;  ? KIDNEY SURGERY    ? ureter surgery to help with drainage  ? KNEE ARTHROSCOPY WITH ANTERIOR CRUCIATE LIGAMENT (ACL) REPAIR Left   ? LEFT HEART CATH AND CORONARY ANGIOGRAPHY N/A 02/22/2019  ? Procedure: LEFT HEART CATH AND CORONARY ANGIOGRAPHY;  Surgeon: [name], [name], MD;  Location: [facility];  Service: Cardiovascular;  Laterality: N/A;  ? ? ?Allergies ? ?No Known Allergies ? ?History of Present Illness  ?  ?61 year old male with the above past medical history including CAD, hypertension, and hyperlipidemia. ? ?He was admitted in April 2020 with NSTEMI.  Cardiac catheterization at the time showed LCx 65%, OM1 50%, p-mRCA 100%-0% s/p DES. EF 55-65%. Echocardiogram showed EF 55 to 60%, mild hypokinesis of the LV, basal mid inferior wall, moderately reduced RV systolic function, no significant valve abnormalities.  He was evaluated in April 2022 and reported 2 episodes of dizziness, mildly elevated BP.  He was last in the office on 03/23/2021 and stable from a cardiac standpoint.  He denied symptoms concerning for angina.  He presented to the ED on 01/27/2022 with substernal chest pain, similar to his prior anginal equivalent, improved with nitroglycerin.  EKG  was unremarkable. Troponin was negative.  Checks x-ray was unremarkable.  Additionally, he reported some mild palpitations and lightheadedness. His saw his PCP on 02/01/2022 for right elbow pain, he was referred to orthopedics who recommended PT exercises. ? ?He presents today for follow-up. Since his last visit he has been stable from a cardiac standpoint.  He denies any further episodes of chest pain, denies dyspnea.  He is active, and runs 4 miles, 3 to 4 days a week.  However, he is concerned that his recent symptoms could be coming from his heart.  At the time of his chest pain, he noted some lightheadedness, and fleeting palpitations.  He has not had any symptoms since.  He states he is interested in pursuing further ischemic evaluation. Other than his recent episode of chest pain, he denies any additional concerns today. ? ?Home Medications  ?  ?Current Outpatient Medications  ?Medication Sig Dispense Refill  ? aspirin EC 81 MG tablet Take 81 mg by mouth daily.    ? atorvastatin (LIPITOR) 80 MG tablet TAKE 1 TABLET(80 MG) BY MOUTH DAILY AT 6 PM 90 tablet 2  ? escitalopram (LEXAPRO) 10 MG tablet TAKE 1 TABLET(10 MG) BY MOUTH DAILY 90 tablet 1  ? Multiple Vitamin (MULTIVITAMIN WITH MINERALS) TABS tablet Take 1 tablet by mouth daily.    ? nitroGLYCERIN (NITROSTAT) 0.4 MG SL tablet Place 1 tablet (0.4 mg total) under the tongue every 5 (five) minutes as needed. 25 tablet 2  ? ?No current facility-administered medications for this visit.  ?  ? ?  Review of Systems  ?  ?He denies chest pain, palpitations, dyspnea, pnd, orthopnea, n, v, dizziness, syncope, edema, weight gain, or early satiety. All other systems reviewed and are otherwise negative except as noted above.  ? ?Physical Exam  ?  ?VS:  BP 122/70 (BP Location: Left Arm, Patient Position: Sitting, Cuff Size: Normal)   Pulse 65   Resp 20   Ht '5\' 8"'$  (1.727 m)   Wt 171 lb (77.6 kg)   SpO2 98%   BMI 26.00 kg/m?   ?GEN: Well nourished, well developed, in no  acute distress. ?HEENT: normal. ?Neck: Supple, no JVD, carotid bruits, or masses. ?Cardiac: RRR, no murmurs, rubs, or gallops. No clubbing, cyanosis, edema.  Radials/DP/PT 2+ and equal bilaterally.  ?Respiratory:  Respirations regular and unlabored, clear to auscultation bilaterally. ?GI: Soft, nontender, nondistended, BS + x 4. ?MS: no deformity or atrophy. ?Skin: warm and dry, no rash. ?Neuro:  Strength and sensation are intact. ?Psych: Normal affect. ? ?Accessory Clinical Findings  ?  ?ECG personally reviewed by me today -NSR, 65 bpm- no acute changes. ? ?Lab Results  ?Component Value Date  ? WBC 5.5 01/27/2022  ? HGB 14.1 01/27/2022  ? HCT 41.9 01/27/2022  ? MCV 86.0 01/27/2022  ? PLT 172 01/27/2022  ? ?Lab Results  ?Component Value Date  ? CREATININE 0.96 01/27/2022  ? BUN 23 (H) 01/27/2022  ? NA 140 01/27/2022  ? K 4.2 01/27/2022  ? CL 106 01/27/2022  ? CO2 28 01/27/2022  ? ?Lab Results  ?Component Value Date  ? ALT 33 01/27/2022  ? AST 27 01/27/2022  ? ALKPHOS 47 01/27/2022  ? BILITOT 0.6 01/27/2022  ? ?Lab Results  ?Component Value Date  ? CHOL 137 10/03/2021  ? HDL 49.00 10/03/2021  ? [place] 67 10/03/2021  ? TRIG 107.0 10/03/2021  ? CHOLHDL 3 10/03/2021  ?  ?Lab Results  ?Component Value Date  ? HGBA1C 5.2 02/23/2021  ? ? ?Assessment & Plan  ?  ?1. CAD: S/p DES-p-mRCA in 2020, EF 55-60%. Recent ED visit for chest pain, similar to prior anginal equivalent.  He described it as a substernal chest pain, which radiated to his right arm, with associated lightheadedness and fleeting palpitations.  Troponin was negative, EKG was unremarkable.  He has not had any further episodes since, however, he finds his symptoms concerning for possible angina.  EKG today is unremarkable.  Of note, he possibly injured his right arm while lifting weights and has been evaluated by orthopedics, he is doing home PT exercises and has noted some improvement.  He is uncertain whether or not his chest and arm pain was related to his  recent injury, however, he is interested in further ischemic evaluation. Given history of CAD and recent symptoms, and through shared decision making, will pursue cardiac PET scan.  Discussed ED precautions.  Continue aspirin, Lipitor. ? ?Shared Decision Making/Informed Consent ?The risks [chest pain, shortness of breath, cardiac arrhythmias, dizziness, blood pressure fluctuations, myocardial infarction, stroke/transient ischemic attack, nausea, vomiting, allergic reaction, radiation exposure, metallic taste sensation and life-threatening complications (estimated to be 1 in 10,000)], benefits (risk stratification, diagnosing coronary artery disease, treatment guidance) and alternatives of a cardiac PET stress test were discussed in detail with Mr. Lema and he agrees to proceed.   ? ?2. Lightheadedness/palpitations: Occurred in the setting of recent chest pain, no recurrent symptoms. EKG shows NSR, 65 bpm.  Should he have more frequent or persistent palpitations or recurrent lightheadedness could consider outpatient cardiac monitor-  no indication at this time. ? ?3. Hypertension: BP well controlled. He does report some lower BP with SBP occasionally in the 90s, however he is asymptomatic.  He is not on any antihypertensive medications at this time.  ? ?4. Hyperlipidemia: LDL was 67 in 09/2021.  Continue Lipitor. ? ?5. Disposition: Follow-up in 4-6 weeks.  ? ?Lenna Sciara, NP ?03/08/2022, 9:07 AM ?  ? ?

## 2022-03-08 ENCOUNTER — Ambulatory Visit: Payer: BC Managed Care – PPO | Admitting: Nurse Practitioner

## 2022-03-08 ENCOUNTER — Encounter: Payer: Self-pay | Admitting: Nurse Practitioner

## 2022-03-08 VITALS — BP 122/70 | HR 65 | Resp 20 | Ht 68.0 in | Wt 171.0 lb

## 2022-03-08 DIAGNOSIS — I10 Essential (primary) hypertension: Secondary | ICD-10-CM | POA: Diagnosis not present

## 2022-03-08 DIAGNOSIS — E785 Hyperlipidemia, unspecified: Secondary | ICD-10-CM

## 2022-03-08 DIAGNOSIS — I2510 Atherosclerotic heart disease of native coronary artery without angina pectoris: Secondary | ICD-10-CM

## 2022-03-08 DIAGNOSIS — R42 Dizziness and giddiness: Secondary | ICD-10-CM | POA: Diagnosis not present

## 2022-03-08 DIAGNOSIS — R002 Palpitations: Secondary | ICD-10-CM | POA: Diagnosis not present

## 2022-03-08 DIAGNOSIS — R079 Chest pain, unspecified: Secondary | ICD-10-CM

## 2022-03-08 NOTE — Patient Instructions (Addendum)
Medication Instructions:  ?Your physician recommends that you continue on your current medications as directed. Please refer to the Current Medication list given to you today. ? ?*If you need a refill on your cardiac medications before your next appointment, please call your pharmacy* ? ? ?Lab Work: ?NONE ordered at this time of appointment  ? ?If you have labs (blood work) drawn today and your tests are completely normal, you will receive your results only by: ?MyChart Message (if you have MyChart) OR ?A paper copy in the mail ?If you have any lab test that is abnormal or we need to change your treatment, we will call you to review the results. ? ? ?Testing/Procedures: ? How to Prepare for Your Cardiac PET/CT Stress Test: ? ?1. Please do not take these medications before your test:  ? ?Medications that may interfere with the cardiac pharmacological stress agent (ex. nitrates or beta-blockers) the day of the exam. ?Theophylline containing medications for 12 hours. ?Dipyridamole 48 hours prior to the test. ?Your remaining medications may be taken with water. ? ?2. Nothing to eat or drink, except water, 3 hours prior to arrival time.   ?NO caffeine/decaffeinated products, or chocolate 12 hours prior to arrival. ? ?3. NO perfume, cologne or lotion ? ?4. Total time is 1 to 2 hours; you may want to bring reading material for the waiting time. ? ?5. Please report to Admitting at the [facility] Entrance 60 minutes early for your test. ? [address] ? [place], [place] [address] ? ?Diabetic Preparation:  ?Hold oral medications. ?You may take NPH and Lantus insulin. ?Do not take Humalog or Humulin R (Regular Insulin) the day of your test. ?Check blood sugars prior to leaving the house. ?If able to eat breakfast prior to 3 hour fasting, you may take all medications, including your insulin, ?Do not worry if you miss your breakfast dose of insulin - start at your next meal. ? ?IF YOU THINK YOU MAY BE  PREGNANT, OR ARE NURSING PLEASE INFORM THE TECHNOLOGIST. ? ?In preparation for your appointment, medication and supplies will be purchased.  Appointment availability is limited, so if you need to cancel or reschedule, please call the Radiology Department at [phone]  24 hours in advance to avoid a cancellation fee of $100.00 ? ?What to Expect After you Arrive: ? ?Once you arrive and check in for your appointment, you will be taken to a preparation room within the Radiology Department.  A technologist or Nurse will obtain your medical history, verify that you are correctly prepped for the exam, and explain the procedure.  Afterwards,  an IV will be started in your arm and electrodes will be placed on your skin for EKG monitoring during the stress portion of the exam. Then you will be escorted to the PET/CT scanner.  There, staff will get you positioned on the scanner and obtain a blood pressure and EKG.  During the exam, you will continue to be connected to the EKG and blood pressure machines.  A small, safe amount of a radioactive tracer will be injected in your IV to obtain a series of pictures of your heart along with an injection of a stress agent.   ? ?After your Exam: ? ?It is recommended that you eat a meal and drink a caffeinated beverage to counter act any effects of the stress agent.  Drink plenty of fluids for the remainder of the day and urinate frequently for the first couple of hours after  the exam.  Your doctor will inform you of your test results within 7-10 business days. ? ?For questions about your test or how to prepare for your test, please call: ?Marchia Bond, Cardiac Imaging Nurse Navigator  ?Gordy Clement, Cardiac Imaging Nurse Navigator ?Office: [phone]  ? ? ?Follow-Up: ?At [facility], you and your health needs are our priority.  As part of our continuing mission to provide you with exceptional heart care, we have created designated Provider Care Teams.  These Care Teams include  your primary Cardiologist (physician) and Advanced Practice Providers (APPs -  Physician Assistants and Nurse Practitioners) who all work together to provide you with the care you need, when you need it. ? ?We recommend signing up for the patient portal called "MyChart".  Sign up information is provided on this After Visit Summary.  MyChart is used to connect with patients for Virtual Visits (Telemedicine).  Patients are able to view lab/test results, encounter notes, upcoming appointments, etc.  Non-urgent messages can be sent to your provider as well.   ?To learn more about what you can do with MyChart, go to [email or web address].   ? ?Your next appointment:   ?4-6 week(s) ? ?The format for your next appointment:   ?In Person ? ?Provider:   ?Kirk Ruths, MD  or Diona Browner, NP      ? ? ?Other Instructions ? ? ?Important Information About Sugar ? ? ? ? ?

## 2022-03-21 ENCOUNTER — Other Ambulatory Visit: Payer: Self-pay

## 2022-03-21 MED ORDER — ATORVASTATIN CALCIUM 80 MG PO TABS: ORAL_TABLET | ORAL | 3 refills | Status: DC

## 2022-04-05 NOTE — Telephone Encounter (Deleted)
NL scheduling is not able to reschedule cardiac PET's. The original appointment requesting to be rescheduled was scheduled by Roderic Ovens at [facility] in the Radiology Nuclear Dept.

## 2022-04-10 ENCOUNTER — Ambulatory Visit: Payer: BC Managed Care – PPO | Admitting: Cardiology

## 2022-04-25 ENCOUNTER — Ambulatory Visit: Payer: BC Managed Care – PPO | Admitting: Family

## 2022-04-25 ENCOUNTER — Ambulatory Visit: Payer: BC Managed Care – PPO | Admitting: Family Medicine

## 2022-04-25 ENCOUNTER — Encounter: Payer: Self-pay | Admitting: Family Medicine

## 2022-04-25 VITALS — BP 120/78 | HR 75 | Temp 99.0°F | Wt 167.1 lb

## 2022-04-25 DIAGNOSIS — L237 Allergic contact dermatitis due to plants, except food: Secondary | ICD-10-CM

## 2022-04-25 MED ORDER — METHYLPREDNISOLONE 4 MG PO TBPK: ORAL_TABLET | ORAL | 0 refills | Status: DC

## 2022-04-25 NOTE — Progress Notes (Signed)
   Subjective:    Patient ID: Alexander Le, male    DOB: 1961/01/13, 61 y.o.   MRN: [id number]  HPI Here for 4 days of an itchy rash on the left foot. Applying Calamine lotion.    Review of Systems  Constitutional: Negative.   Respiratory: Negative.    Cardiovascular: Negative.   Skin:  Positive for rash.       Objective:   Physical Exam Constitutional:      Appearance: Normal appearance.  Cardiovascular:     Rate and Rhythm: Normal rate and regular rhythm.     Pulses: Normal pulses.     Heart sounds: Normal heart sounds.  Pulmonary:     Effort: Pulmonary effort is normal.     Breath sounds: Normal breath sounds.  Skin:    Comments: Dorsal left foot has clusters of red vesicles   Neurological:     Mental Status: He is alert.           Assessment & Plan:  Contact dermatitis, treat with a Medrol dose pack.  Alysia Penna, MD

## 2022-05-09 ENCOUNTER — Other Ambulatory Visit: Payer: Self-pay | Admitting: *Deleted

## 2022-05-09 MED ORDER — ESCITALOPRAM OXALATE 10 MG PO TABS: 10.0000 mg | ORAL_TABLET | Freq: Every day | ORAL | 0 refills | Status: DC

## 2022-05-27 ENCOUNTER — Telehealth (HOSPITAL_COMMUNITY): Payer: Self-pay | Admitting: Emergency Medicine

## 2022-05-27 NOTE — Telephone Encounter (Signed)
Attempted to call patient regarding upcoming cardiac PET appointment. Left message on voicemail with name and callback number Kerby Hockley RN Navigator Cardiac Imaging [facility] [phone] Office [phone] Cell  

## 2022-05-28 ENCOUNTER — Ambulatory Visit (HOSPITAL_COMMUNITY)
Admission: RE | Admit: 2022-05-28 | Discharge: 2022-05-28 | Disposition: A | Discharge status: Home / Self Care | Payer: BC Managed Care – PPO | Source: Ambulatory Visit | Attending: Nurse Practitioner | Admitting: Nurse Practitioner

## 2022-05-28 DIAGNOSIS — R079 Chest pain, unspecified: Secondary | ICD-10-CM

## 2022-05-28 DIAGNOSIS — I2510 Atherosclerotic heart disease of native coronary artery without angina pectoris: Secondary | ICD-10-CM

## 2022-05-28 LAB — NM PET CT CARDIAC PERFUSION MULTI W/ABSOLUTE BLOODFLOW
LV dias vol: 116 mL (ref 62–150)
LV sys vol: 44 mL
MBFR: 3.2
Nuc Rest EF: 52 %
Nuc Stress EF: 62 %
Rest MBF: 0.55 ml/g/min
ST Depression (mm): 0 mm
Stress MBF: 1.76 ml/g/min

## 2022-05-28 MED ORDER — REGADENOSON 0.4 MG/5ML IV SOLN
INTRAVENOUS | Status: AC
  Administered 2022-05-28: 0.4 mg
  Filled 2022-05-28: qty 5

## 2022-05-28 MED ORDER — RUBIDIUM RB82 GENERATOR (RUBYFILL)
19.6700 | PACK | Freq: Once | INTRAVENOUS | Status: AC
  Administered 2022-05-28: 19.67 via INTRAVENOUS

## 2022-05-28 MED ORDER — RUBIDIUM RB82 GENERATOR (RUBYFILL)
30.0000 | PACK | Freq: Once | INTRAVENOUS | Status: AC
  Administered 2022-05-28: 19.31 via INTRAVENOUS

## 2022-06-04 NOTE — Progress Notes (Signed)
HPI: Follow-up coronary artery disease. Admitted April 2020 and ruled in for non-ST elevation myocardial infarction.  Cardiac catheterization revealed a 65% circumflex, 50% first marginal, occluded right coronary artery and normal LV function.  Patient had PCI of RCA with drug-eluting stent.  Echocardiogram April 2020 showed ejection fraction 55 to 60%, moderate RV dysfunction, mild right atrial enlargement.  PET scan July 2023 showed ejection fraction at rest of 52% and 62% at stress.  Perfusion was normal.  Since last seen there is no dyspnea on exertion, orthopnea, PND, pedal edema, exertional chest pain or syncope.  He does note fatigue at times and lethargy.  Patient is able to run 4 miles daily with no symptoms.  Current Outpatient Medications  Medication Sig Dispense Refill   aspirin EC 81 MG tablet Take 81 mg by mouth daily.     atorvastatin (LIPITOR) 80 MG tablet TAKE 1 TABLET(80 MG) BY MOUTH DAILY AT 6 PM 90 tablet 3   escitalopram (LEXAPRO) 10 MG tablet Take 1 tablet (10 mg total) by mouth daily. 90 tablet 0   methylPREDNISolone (MEDROL DOSEPAK) 4 MG TBPK tablet As directed 21 tablet 0   Multiple Vitamin (MULTIVITAMIN WITH MINERALS) TABS tablet Take 1 tablet by mouth daily.     nitroGLYCERIN (NITROSTAT) 0.4 MG SL tablet Place 1 tablet (0.4 mg total) under the tongue every 5 (five) minutes as needed. 25 tablet 2   No current facility-administered medications for this visit.     Past Medical History:  Diagnosis Date   Hyperlipidemia    NSTEMI (non-ST elevated myocardial infarction) ([place])    02/21/19 PCI/DES to 100% mRCA, 65% in pLCX, normal EF    Past Surgical History:  Procedure Laterality Date   CORONARY STENT INTERVENTION N/A 02/22/2019   Procedure: CORONARY STENT INTERVENTION;  Surgeon: [name], [name], MD;  Location: [place] [facility];  Service: Cardiovascular;  Laterality: N/A;   KIDNEY SURGERY     ureter surgery to help with drainage   KNEE ARTHROSCOPY WITH ANTERIOR  CRUCIATE LIGAMENT (ACL) REPAIR Left    LEFT HEART CATH AND CORONARY ANGIOGRAPHY N/A 02/22/2019   Procedure: LEFT HEART CATH AND CORONARY ANGIOGRAPHY;  Surgeon: [name], [name], MD;  Location: [place] [facility];  Service: Cardiovascular;  Laterality: N/A;    Social History   Socioeconomic History   Marital status: Married    Spouse name: Not on file   Number of children: Not on file   Years of education: Not on file   Highest education level: Not on file  Occupational History   Not on file  Tobacco Use   Smoking status: Never   Smokeless tobacco: Never  Vaping Use   Vaping Use: Never used  Substance and Sexual Activity   Alcohol use: Yes    Alcohol/week: 2.0 standard drinks of alcohol    Types: 2 Glasses of wine per week    Comment: rare   Drug use: Never   Sexual activity: Not on file  Other Topics Concern   Not on file  Social History Narrative   Not on file   Social Determinants of Health   Financial Resource Strain: Not on file  Food Insecurity: Not on file  Transportation Needs: Not on file  Physical Activity: Not on file  Stress: Not on file  Social Connections: Not on file  Intimate Partner Violence: Not on file    Family History  Problem Relation Age of Onset   Heart attack Brother  43s   Heart attack Mother 67   Other Father        didn't know father   Dementia Maternal Grandmother    Renal Disease Brother    Other Brother        unknown/?overdose    ROS: no fevers or chills, productive cough, hemoptysis, dysphasia, odynophagia, melena, hematochezia, dysuria, hematuria, rash, seizure activity, orthopnea, PND, pedal edema, claudication. Remaining systems are negative.  Physical Exam: Well-developed well-nourished in no acute distress.  Skin is warm and dry.  HEENT is normal.  Neck is supple.  Chest is clear to auscultation with normal expansion.  Cardiovascular exam is regular rate and rhythm.  Abdominal exam nontender or distended. No  masses palpated. Extremities show no edema. neuro grossly intact  A/P  1 coronary artery disease-patient denies recurrent chest pain since recent PET scan.  His study was normal.  We will continue medical therapy with aspirin and statin.  2 hyperlipidemia-continue statin.  Check lipids and liver.  3 fatigue/lethargy-etiology unclear.  Check TSH and hemoglobin.  Kirk Ruths, MD

## 2022-06-05 ENCOUNTER — Telehealth: Payer: Self-pay

## 2022-06-05 NOTE — Telephone Encounter (Signed)
Spoke with pt. Pt was notified of Cardiac PET CT results. Pt voiced understanding and will follow up as planned.

## 2022-06-17 ENCOUNTER — Encounter: Payer: Self-pay | Admitting: Cardiology

## 2022-06-17 ENCOUNTER — Ambulatory Visit: Payer: BC Managed Care – PPO | Admitting: Cardiology

## 2022-06-17 VITALS — BP 113/72 | HR 62 | Ht 69.0 in | Wt 166.4 lb

## 2022-06-17 DIAGNOSIS — R5383 Other fatigue: Secondary | ICD-10-CM

## 2022-06-17 DIAGNOSIS — E785 Hyperlipidemia, unspecified: Secondary | ICD-10-CM | POA: Diagnosis not present

## 2022-06-17 DIAGNOSIS — I2510 Atherosclerotic heart disease of native coronary artery without angina pectoris: Secondary | ICD-10-CM

## 2022-06-17 LAB — COMPREHENSIVE METABOLIC PANEL
ALT: 23 IU/L (ref 0–44)
AST: 24 IU/L (ref 0–40)
Albumin/Globulin Ratio: 2 (ref 1.2–2.2)
Albumin: 4.1 g/dL (ref 3.8–4.9)
Alkaline Phosphatase: 58 IU/L (ref 44–121)
BUN/Creatinine Ratio: 15 (ref 9–20)
BUN: 16 mg/dL (ref 6–24)
Bilirubin Total: 0.5 mg/dL (ref 0.0–1.2)
CO2: 26 mmol/L (ref 20–29)
Calcium: 9.1 mg/dL (ref 8.7–10.2)
Chloride: 108 mmol/L — ABNORMAL HIGH (ref 96–106)
Creatinine, Ser: 1.05 mg/dL (ref 0.76–1.27)
Globulin, Total: 2.1 g/dL (ref 1.5–4.5)
Glucose: 80 mg/dL (ref 70–99)
Potassium: 5.2 mmol/L (ref 3.5–5.2)
Sodium: 142 mmol/L (ref 134–144)
Total Protein: 6.2 g/dL (ref 6.0–8.5)
eGFR: 82 mL/min/{1.73_m2} (ref >59)

## 2022-06-17 LAB — LIPID PANEL
Chol/HDL Ratio: 2.6 ratio (ref 0.0–5.0)
Cholesterol, Total: 131 mg/dL (ref 100–199)
HDL: 51 mg/dL (ref >39)
LDL Chol Calc (NIH): 64 mg/dL (ref 0–99)
Triglycerides: 82 mg/dL (ref 0–149)
VLDL Cholesterol Cal: 16 mg/dL (ref 5–40)

## 2022-06-17 LAB — CBC
Hematocrit: 42.6 % (ref 37.5–51.0)
Hemoglobin: 14.3 g/dL (ref 13.0–17.7)
MCH: 29.1 pg (ref 26.6–33.0)
MCHC: 33.6 g/dL (ref 31.5–35.7)
MCV: 87 fL (ref 79–97)
Platelets: 160 10*3/uL (ref 150–450)
RBC: 4.91 x10E6/uL (ref 4.14–5.80)
RDW: 13.4 % (ref 11.6–15.4)
WBC: 4.2 10*3/uL (ref 3.4–10.8)

## 2022-06-17 LAB — CK: Total CK: 150 U/L (ref 41–331)

## 2022-06-17 LAB — TSH: TSH: 3.17 u[IU]/mL (ref 0.450–4.500)

## 2022-06-17 NOTE — Patient Instructions (Addendum)

## 2022-06-20 ENCOUNTER — Encounter: Payer: Self-pay | Admitting: *Deleted

## 2022-06-20 ENCOUNTER — Encounter: Payer: Self-pay | Admitting: Family Medicine

## 2022-06-20 ENCOUNTER — Ambulatory Visit: Payer: BC Managed Care – PPO | Admitting: Family Medicine

## 2022-06-20 VITALS — BP 100/62 | HR 63 | Temp 97.6°F | Ht 69.0 in | Wt 168.3 lb

## 2022-06-20 DIAGNOSIS — I2510 Atherosclerotic heart disease of native coronary artery without angina pectoris: Secondary | ICD-10-CM

## 2022-06-20 DIAGNOSIS — Z125 Encounter for screening for malignant neoplasm of prostate: Secondary | ICD-10-CM | POA: Diagnosis not present

## 2022-06-20 DIAGNOSIS — Z1211 Encounter for screening for malignant neoplasm of colon: Secondary | ICD-10-CM | POA: Diagnosis not present

## 2022-06-20 LAB — PSA: PSA: 0.3 ng/mL (ref 0.10–4.00)

## 2022-06-20 NOTE — Progress Notes (Signed)
Established Patient Office Visit  Subjective   Patient ID: Alexander Le, male    DOB: Apr 04, 1961  Age: 61 y.o. MRN: [id number]  Chief Complaint  Patient presents with   Establish Care    Patient is here for a follow up visit.   CAD - pt reports he recently underwent Cardiac imaging - has a history of stent placement and is on aspirin 81 mg and lipitor 80 mg daily. Denies any recent chest pain or SOB, states that recently he had been feeling fatigued and mentioned this to his cardiologist who ordered additional labs. I reviewed the labs that were ordered and I went over them with the patient. I asked about his sleep patterns and the quality of his sleep at night, he reports he gets regular sleep, only waking up once per night, states that he does not snore and he feels well rested. States that he runs 4 miles a day and his BP is always low which might be contributing to his fatigue.  Preventative screening tests were reviewed, he is due for a PSA and colon cancer screening (October). I have ordered these tests. I also reviewed his immunizations and recommended that he check with his insurance company to make sure the shingrix is covered before it is ordered.    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     Objective:     BP 100/62 (BP Location: Left Arm, Patient Position: Sitting, Cuff Size: Normal)   Pulse 63   Temp 97.6 F (36.4 C) (Oral)   Ht $R'5\' 9"'hm$  (1.753 m)   Wt 168 lb 4.8 oz (76.3 kg)   SpO2 98%   BMI 24.85 kg/m  BP Readings from Last 3 Encounters:  06/20/22 100/62  06/17/22 113/72  04/25/22 120/78      Physical Exam Vitals reviewed.  Constitutional:      Appearance: Normal appearance. He is well-groomed and normal  weight.  HENT:     Head: Normocephalic and atraumatic.  Eyes:     Extraocular Movements: Extraocular movements intact.     Pupils: Pupils are equal, round, and reactive to light.  Cardiovascular:     Rate and Rhythm: Normal rate and regular rhythm.     Heart sounds: S1 normal and S2 normal. No murmur heard. Pulmonary:     Effort: Pulmonary effort is normal.     Breath sounds: Normal breath sounds and air entry. No rales.  Abdominal:     General: Abdomen is flat. Bowel sounds are normal.     Tenderness: There is no abdominal tenderness.  Musculoskeletal:     Right lower leg: No edema.     Left lower leg: No edema.  Neurological:     General: No focal deficit present.     Mental Status: He is alert and oriented to person, place, and time.     Gait: Gait is intact.  Psychiatric:        Mood and Affect: Mood and affect normal.      No results found for any visits on 06/20/22.  Last CBC Lab Results  Component Value Date   WBC 4.2 06/17/2022   HGB 14.3 06/17/2022   HCT 42.6 06/17/2022   MCV 87 06/17/2022  MCH 29.1 06/17/2022   RDW 13.4 06/17/2022   PLT 160 60/45/4098   Last metabolic panel Lab Results  Component Value Date   GLUCOSE 80 06/17/2022   NA 142 06/17/2022   K 5.2 06/17/2022   CL 108 (H) 06/17/2022   CO2 26 06/17/2022   BUN 16 06/17/2022   CREATININE 1.05 06/17/2022   EGFR 82 06/17/2022   CALCIUM 9.1 06/17/2022   PROT 6.2 06/17/2022   ALBUMIN 4.1 06/17/2022   LABGLOB 2.1 06/17/2022   AGRATIO 2.0 06/17/2022   BILITOT 0.5 06/17/2022   ALKPHOS 58 06/17/2022   AST 24 06/17/2022   ALT 23 06/17/2022   ANIONGAP 6 01/27/2022   Last lipids Lab Results  Component Value Date   CHOL 131 06/17/2022   HDL 51 06/17/2022   LDLCALC 64 06/17/2022   TRIG 82 06/17/2022   CHOLHDL 2.6 06/17/2022   Last thyroid functions Lab Results  Component Value Date   TSH 3.170 06/17/2022      The ASCVD Risk score (Arnett DK, et al., 2019) failed to calculate for the  following reasons:   The patient has a prior MI or stroke diagnosis    Assessment & Plan:   Problem List Items Addressed This Visit       Cardiovascular and Mediastinum   CAD (coronary artery disease) - Primary    Reviewed cardiac imaging which is normal. I also reviewed his labs in relation to the recent fatigue he has been experiencing. He essentially screened negative today for OSA symptoms, I recommended increasing hydration to help support his blood pressures.  He continues to follow with cardiology regularly, continue aspirin 81 mg and lipitor 80 mg daily. RTC in 6 months for follow up.      Other Visit Diagnoses     Screening for colon cancer       Relevant Orders   Cologuard   Screening for malignant neoplasm of prostate       Relevant Orders   PSA       Return in about 6 months (around 12/21/2022) for follow up on CAD.    [name], MD

## 2022-06-20 NOTE — Assessment & Plan Note (Addendum)
Reviewed cardiac imaging which is normal. I also reviewed his labs in relation to the recent fatigue he has been experiencing. He essentially screened negative today for OSA symptoms, I recommended increasing hydration to help support his blood pressures.  He continues to follow with cardiology regularly, continue aspirin 81 mg and lipitor 80 mg daily. RTC in 6 months for follow up.

## 2022-06-21 NOTE — Progress Notes (Signed)
Normal PSA

## 2022-08-05 ENCOUNTER — Other Ambulatory Visit: Payer: Self-pay | Admitting: Family

## 2022-08-13 ENCOUNTER — Other Ambulatory Visit: Payer: Self-pay | Admitting: *Deleted

## 2022-08-13 MED ORDER — ESCITALOPRAM OXALATE 10 MG PO TABS: 10.0000 mg | ORAL_TABLET | Freq: Every day | ORAL | 3 refills | Status: DC

## 2022-11-26 ENCOUNTER — Encounter: Payer: Self-pay | Admitting: Family Medicine

## 2022-11-26 DIAGNOSIS — Z1211 Encounter for screening for malignant neoplasm of colon: Secondary | ICD-10-CM

## 2022-11-27 ENCOUNTER — Encounter: Payer: Self-pay | Admitting: Gastroenterology

## 2022-11-27 NOTE — Telephone Encounter (Signed)
Ok to place referral to GI for colonoscopy-- colon cancer screening dx

## 2022-12-06 ENCOUNTER — Ambulatory Visit (AMBULATORY_SURGERY_CENTER): Payer: BC Managed Care – PPO | Admitting: *Deleted

## 2022-12-06 VITALS — Ht 69.0 in | Wt 165.0 lb

## 2022-12-06 DIAGNOSIS — Z1211 Encounter for screening for malignant neoplasm of colon: Secondary | ICD-10-CM

## 2022-12-06 MED ORDER — NA SULFATE-K SULFATE-MG SULF 17.5-3.13-1.6 GM/177ML PO SOLN: 1.0000 | Freq: Once | ORAL | 0 refills | Status: AC

## 2022-12-06 NOTE — Progress Notes (Signed)
No egg or soy allergy known to patient  No issues known to pt with past sedation with any surgeries or procedures Patient denies ever being told they had issues or difficulty with intubation  No FH of Malignant Hyperthermia Pt is not on diet pills Pt is not on  home 02  Pt is not on blood thinners  Pt denies issues with constipation  Pt is not on dialysis Pt denies any upcoming cardiac testing Pt encouraged to use to use Singlecare or Goodrx to reduce cost Patient's chart reviewed by Osvaldo Angst CNRA prior to previsit and patient appropriate for the [place].  Previsit completed and red dot placed by patient's name on their procedure day (on provider's schedule).  . Visit by phone Instructions sent by mail with coupon and by my chart

## 2022-12-23 ENCOUNTER — Encounter: Payer: Self-pay | Admitting: Gastroenterology

## 2022-12-29 ENCOUNTER — Encounter: Payer: Self-pay | Admitting: Certified Registered Nurse Anesthetist

## 2023-01-03 ENCOUNTER — Encounter: Payer: Self-pay | Admitting: Gastroenterology

## 2023-01-03 ENCOUNTER — Ambulatory Visit (AMBULATORY_SURGERY_CENTER): Payer: BC Managed Care – PPO | Admitting: Gastroenterology

## 2023-01-03 VITALS — BP 137/77 | HR 67 | Temp 97.7°F | Resp 12 | Ht 69.0 in | Wt 165.0 lb

## 2023-01-03 DIAGNOSIS — Z1211 Encounter for screening for malignant neoplasm of colon: Secondary | ICD-10-CM

## 2023-01-03 DIAGNOSIS — D125 Benign neoplasm of sigmoid colon: Secondary | ICD-10-CM | POA: Diagnosis not present

## 2023-01-03 DIAGNOSIS — K635 Polyp of colon: Secondary | ICD-10-CM

## 2023-01-03 DIAGNOSIS — D123 Benign neoplasm of transverse colon: Secondary | ICD-10-CM

## 2023-01-03 HISTORY — PX: COLONOSCOPY: SHX174

## 2023-01-03 MED ORDER — SODIUM CHLORIDE 0.9 % IV SOLN: 500.0000 mL | Freq: Once | INTRAVENOUS | Status: DC

## 2023-01-03 NOTE — Progress Notes (Signed)
0842 Ephedrine 10 mg given IV due to low BP, MD updated.

## 2023-01-03 NOTE — Progress Notes (Signed)
Pt's states no medical or surgical changes since previsit or office visit. 

## 2023-01-03 NOTE — Progress Notes (Signed)
[facility] History and Physical   Primary Care Physician:  [name], MD   Reason for Procedure:   Colon cancer screening  Plan:    colonoscopy     HPI: Alexander Le is a 62 y.o. male  here for colonoscopy screening - first time exam. Patient denies any bowel symptoms at this time. No family history of colon cancer known. Otherwise feels well without any cardiopulmonary symptoms.   I have discussed risks / benefits of anesthesia and endoscopic procedure with [name] and they wish to proceed with the exams as outlined today.    Past Medical History:  Diagnosis Date   Hyperlipidemia    NSTEMI (non-ST elevated myocardial infarction) ([place])    02/21/19 PCI/DES to 100% mRCA, 65% in pLCX, normal EF    Past Surgical History:  Procedure Laterality Date   COLONOSCOPY  01/03/2023   CORONARY STENT INTERVENTION N/A 02/22/2019   Procedure: CORONARY STENT INTERVENTION;  Surgeon: [name], [name], MD;  Location: [place] [facility];  Service: Cardiovascular;  Laterality: N/A;   KIDNEY SURGERY     ureter surgery to help with drainage   KNEE ARTHROSCOPY WITH ANTERIOR CRUCIATE LIGAMENT (ACL) REPAIR Left    LEFT HEART CATH AND CORONARY ANGIOGRAPHY N/A 02/22/2019   Procedure: LEFT HEART CATH AND CORONARY ANGIOGRAPHY;  Surgeon: [name], [name], MD;  Location: [place] [facility];  Service: Cardiovascular;  Laterality: N/A;    Prior to Admission medications   Medication Sig Start Date End Date Taking? Authorizing Provider  aspirin EC 81 MG tablet Take 81 mg by mouth daily.   Yes [provider]  atorvastatin (LIPITOR) 80 MG tablet TAKE 1 TABLET(80 MG) BY MOUTH DAILY AT 6 PM 03/21/22  Yes Crenshaw, Denice Bors, MD  escitalopram (LEXAPRO) 10 MG tablet Take 1 tablet (10 mg total) by mouth daily. 08/13/22  Yes [name], MD  Multiple Vitamin (MULTIVITAMIN WITH MINERALS) TABS tablet Take 1 tablet by mouth daily.   Yes [provider]  nitroGLYCERIN  (NITROSTAT) 0.4 MG SL tablet Place 1 tablet (0.4 mg total) under the tongue every 5 (five) minutes as needed. 02/19/21   [name], MD    Current Outpatient Medications  Medication Sig Dispense Refill   aspirin EC 81 MG tablet Take 81 mg by mouth daily.     atorvastatin (LIPITOR) 80 MG tablet TAKE 1 TABLET(80 MG) BY MOUTH DAILY AT 6 PM 90 tablet 3   escitalopram (LEXAPRO) 10 MG tablet Take 1 tablet (10 mg total) by mouth daily. 90 tablet 3   Multiple Vitamin (MULTIVITAMIN WITH MINERALS) TABS tablet Take 1 tablet by mouth daily.     nitroGLYCERIN (NITROSTAT) 0.4 MG SL tablet Place 1 tablet (0.4 mg total) under the tongue every 5 (five) minutes as needed. 25 tablet 2   Current Facility-Administered Medications  Medication Dose Route Frequency Provider Last Rate Last Admin   0.9 %  sodium chloride infusion  500 mL Intravenous Once [name], [name], MD        Allergies as of 01/03/2023   (No Known Allergies)    Family History  Problem Relation Age of Onset   Heart attack Mother 63   Other Father        didn't know father   Heart attack Brother        58s   Renal Disease Brother    Other Brother        unknown/?overdose   Dementia Maternal Grandmother  Colon polyps Neg Hx    Crohn's disease Neg Hx    Esophageal cancer Neg Hx    Rectal cancer Neg Hx    Stomach cancer Neg Hx    Colon cancer Neg Hx     Social History   Socioeconomic History   Marital status: Married    Spouse name: Not on file   Number of children: Not on file   Years of education: Not on file   Highest education level: Not on file  Occupational History   Not on file  Tobacco Use   Smoking status: Never   Smokeless tobacco: Never  Vaping Use   Vaping Use: Never used  Substance and Sexual Activity   Alcohol use: Yes    Alcohol/week: 2.0 standard drinks of alcohol    Types: 2 Glasses of wine per week    Comment: rare   Drug use: Never   Sexual activity: Yes  Other Topics Concern   Not  on file  Social History Narrative   Not on file   Social Determinants of Health   Financial Resource Strain: Not on file  Food Insecurity: Not on file  Transportation Needs: Not on file  Physical Activity: Not on file  Stress: Not on file  Social Connections: Not on file  Intimate Partner Violence: Not on file    Review of Systems: All other review of systems negative except as mentioned in the HPI.  Physical Exam: Vital signs BP (!) 112/97   Pulse (!) 53   Temp 97.7 F (36.5 C) (Temporal)   Resp 13   Ht 5' 9"$  (1.753 m)   Wt 165 lb (74.8 kg)   SpO2 99%   BMI 24.37 kg/m   General:   Alert,  Well-developed, pleasant and cooperative in NAD Lungs:  Clear throughout to auscultation.   Heart:  Regular rate and rhythm Abdomen:  Soft, nontender and nondistended.   Neuro/Psych:  Alert and cooperative. Normal mood and affect. A and O x 3  [name], MD [facility]

## 2023-01-03 NOTE — Patient Instructions (Signed)
Handouts on polyps and hemorrhoids given to you today  Await pathology results   YOU HAD AN ENDOSCOPIC PROCEDURE TODAY AT [facility]:   Refer to the procedure report that was given to you for any specific questions about what was found during the examination.  If the procedure report does not answer your questions, please call your gastroenterologist to clarify.  If you requested that your care partner not be given the details of your procedure findings, then the procedure report has been included in a sealed envelope for you to review at your convenience later.  YOU SHOULD EXPECT: Some feelings of bloating in the abdomen. Passage of more gas than usual.  Walking can help get rid of the air that was put into your GI tract during the procedure and reduce the bloating. If you had a lower endoscopy (such as a colonoscopy or flexible sigmoidoscopy) you may notice spotting of blood in your stool or on the toilet paper. If you underwent a bowel prep for your procedure, you may not have a normal bowel movement for a few days.  Please Note:  You might notice some irritation and congestion in your nose or some drainage.  This is from the oxygen used during your procedure.  There is no need for concern and it should clear up in a day or so.  SYMPTOMS TO REPORT IMMEDIATELY:  Following lower endoscopy (colonoscopy or flexible sigmoidoscopy):  Excessive amounts of blood in the stool  Significant tenderness or worsening of abdominal pains  Swelling of the abdomen that is new, acute  Fever of 100F or higher  For urgent or emergent issues, a gastroenterologist can be reached at any hour by calling [phone]. Do not use MyChart messaging for urgent concerns.    DIET:  We do recommend a small meal at first, but then you may proceed to your regular diet.  Drink plenty of fluids but you should avoid alcoholic beverages for 24 hours.  ACTIVITY:  You should plan to take it easy for the rest  of today and you should NOT DRIVE or use heavy machinery until tomorrow (because of the sedation medicines used during the test).    FOLLOW UP: Our staff will call the number listed on your records the next business day following your procedure.  We will call around 7:15- 8:00 am to check on you and address any questions or concerns that you may have regarding the information given to you following your procedure. If we do not reach you, we will leave a message.     If any biopsies were taken you will be contacted by phone or by letter within the next 1-3 weeks.  Please call us at [phone] if you have not heard about the biopsies in 3 weeks.    SIGNATURES/CONFIDENTIALITY: You and/or your care partner have signed paperwork which will be entered into your electronic medical record.  These signatures attest to the fact that that the information above on your After Visit Summary has been reviewed and is understood.  Full responsibility of the confidentiality of this discharge information lies with you and/or your care-partner.

## 2023-01-03 NOTE — Op Note (Signed)
[facility] Patient Name: Alexander Le Procedure Date: 01/03/2023 8:26 AM MRN: [id number] Endoscopist: Remo Lipps P. Havery Moros , MD, [id number] Age: 62 Referring MD:  Date of Birth: 1961/06/04 Gender: Male Account #: [id number] Procedure:                Colonoscopy Indications:              Screening for colorectal malignant neoplasm, This                            is the patient's first colonoscopy Medicines:                Monitored Anesthesia Care Procedure:                Pre-Anesthesia Assessment:                           - Prior to the procedure, a History and Physical                            was performed, and patient medications and                            allergies were reviewed. The patient's tolerance of                            previous anesthesia was also reviewed. The risks                            and benefits of the procedure and the sedation                            options and risks were discussed with the patient.                            All questions were answered, and informed consent                            was obtained. Prior Anticoagulants: The patient has                            taken no anticoagulant or antiplatelet agents. ASA                            Grade Assessment: III - A patient with severe                            systemic disease. After reviewing the risks and                            benefits, the patient was deemed in satisfactory                            condition to undergo the procedure.  After obtaining informed consent, the colonoscope                            was passed under direct vision. Throughout the                            procedure, the patient's blood pressure, pulse, and                            oxygen saturations were monitored continuously. The                            PCF-HQ190L Colonoscope [id number] was introduced                            through the anus and  advanced to the the cecum,                            identified by appendiceal orifice and ileocecal                            valve. The colonoscopy was performed without                            difficulty. The patient tolerated the procedure                            well. The quality of the bowel preparation was                            good. The ileocecal valve, appendiceal orifice, and                            rectum were photographed. Scope In: 8:31:34 AM Scope Out: [id number] AM Scope Withdrawal Time: 0 hours 18 minutes 57 seconds  Total Procedure Duration: 0 hours 24 minutes 44 seconds  Findings:                 The perianal and digital rectal examinations were                            normal.                           A diminutive polyp was found in the transverse                            colon. The polyp was flat. The polyp was removed                            with a cold snare. Resection and retrieval were                            complete.  A 3 mm polyp was found in the sigmoid colon. The                            polyp was sessile. The polyp was removed with a                            cold snare. Resection and retrieval were complete.                           The colon was extremely tortuous and redundant -                            pediatric colonoscope used to complete the exam.                           Internal hemorrhoids were found during retroflexion.                           The exam was otherwise without abnormality. Complications:            No immediate complications. Estimated blood loss:                            Minimal. Estimated Blood Loss:     Estimated blood loss was minimal. Impression:               - One diminutive polyp in the transverse colon,                            removed with a cold snare. Resected and retrieved.                           - One 3 mm polyp in the sigmoid colon, removed with                             a cold snare. Resected and retrieved.                           - Tortuous colon.                           - Internal hemorrhoids.                           - The examination was otherwise normal. Recommendation:           - Patient has a contact number available for                            emergencies. The signs and symptoms of potential                            delayed complications were discussed with the  patient. Return to normal activities tomorrow.                            Written discharge instructions were provided to the                            patient.                           - Resume previous diet.                           - Continue present medications.                           - Await pathology results. Remo Lipps P. Krystiana Fornes, MD 01/03/2023 9:00:58 AM This report has been signed electronically.

## 2023-01-03 NOTE — Progress Notes (Signed)
Report given to PACU, vss 

## 2023-01-06 ENCOUNTER — Telehealth: Payer: Self-pay

## 2023-01-06 NOTE — Telephone Encounter (Signed)
  Follow up Call-     01/03/2023    8:05 AM  Call back number  Post procedure Call Back phone  # [phone]  Permission to leave phone message Yes     Follow up call made.  NALM

## 2023-01-13 ENCOUNTER — Encounter: Payer: Self-pay | Admitting: Gastroenterology

## 2023-04-11 ENCOUNTER — Other Ambulatory Visit: Payer: Self-pay | Admitting: Cardiology

## 2023-04-11 ENCOUNTER — Other Ambulatory Visit: Payer: Self-pay

## 2023-04-11 MED ORDER — ATORVASTATIN CALCIUM 80 MG PO TABS: 80.0000 mg | ORAL_TABLET | Freq: Every day | ORAL | 0 refills | Status: DC

## 2023-05-08 ENCOUNTER — Encounter: Payer: Self-pay | Admitting: Family Medicine

## 2023-05-08 ENCOUNTER — Ambulatory Visit: Payer: BC Managed Care – PPO | Admitting: Family Medicine

## 2023-05-08 VITALS — BP 100/60 | HR 65 | Temp 97.5°F | Ht 69.0 in | Wt 168.9 lb

## 2023-05-08 DIAGNOSIS — M791 Myalgia, unspecified site: Secondary | ICD-10-CM | POA: Diagnosis not present

## 2023-05-08 LAB — CK: Total CK: 189 U/L (ref 7–232)

## 2023-05-08 LAB — VITAMIN D 25 HYDROXY (VIT D DEFICIENCY, FRACTURES): VITD: 35.82 ng/mL (ref 30.00–100.00)

## 2023-05-08 LAB — VITAMIN B12: Vitamin B-12: 797 pg/mL (ref 211–911)

## 2023-05-08 NOTE — Progress Notes (Signed)
   Acute Office Visit  Subjective:     Patient ID: Alexander Le, male    DOB: 06-10-1961, 62 y.o.   MRN: [id number]  Chief Complaint  Patient presents with   Leg Pain    Patinet complains of bilateral leg pain, x4 weeks    Nocturia    Patient complains of nocturia, x6 months    Fatigue    Patient complains of fatigue    Leg Pain    Patient is in today for leg pain for about 4 weeks. States they are feeling more achy, states that he is waking up at night to urinate also. States that sometimes his stream is not as strong as before. States that he works out regularly, doesn't hurt when working out, just when he is standing, sometimes legs feel stiff/sore in the morning. Happens mostly from the knees down.   Review of Systems  Cardiovascular:  Negative for palpitations, claudication and leg swelling.  Genitourinary:  Negative for dysuria, flank pain, hematuria and urgency.  Musculoskeletal:  Positive for myalgias.  All other systems reviewed and are negative.       Objective:    BP 100/60 (BP Location: Left Arm, Patient Position: Sitting, Cuff Size: Normal)   Pulse 65   Temp (!) 97.5 F (36.4 C) (Oral)   Ht 5\' 9"  (1.753 m)   Wt 168 lb 14.4 oz (76.6 kg)   SpO2 98%   BMI 24.94 kg/m    Physical Exam Vitals reviewed.  Constitutional:      Appearance: Normal appearance. He is well-groomed and normal weight.  Pulmonary:     Effort: Pulmonary effort is normal.  Musculoskeletal:     Right lower leg: Normal. No tenderness. No edema.     Left lower leg: Normal. No tenderness. No edema.  Neurological:     General: No focal deficit present.     Mental Status: He is alert and oriented to person, place, and time.     Gait: Gait is intact.  Psychiatric:        Mood and Affect: Affect normal.     No results found for any visits on 05/08/23.      Assessment & Plan:   Problem List Items Addressed This Visit   None Visit Diagnoses     Muscle soreness    -  Primary    Relevant Orders   CK   Vitamin B12   Vitamin D, 25-hydroxy      No abnormalities on physical exam found. We had a long talk about his statin medication and the role it might be playing his the muscle soreness. He is on 80 mg daily, I recommended we decrease to 40 mg daily and wait a couple weeks to see if his muscle soreness resolves. He does have CAD and is s/p stent so he needs to remain on the medication, however 40 mg daily should be an adequate dose since he is 4 years out from his stent. Will check CK, B12 and D to look for other causes.  No orders of the defined types were placed in this encounter.   No follow-ups on file.  Karie Georges, MD

## 2023-05-09 ENCOUNTER — Encounter: Payer: Self-pay | Admitting: Family Medicine

## 2023-05-29 ENCOUNTER — Encounter: Payer: BC Managed Care – PPO | Admitting: Family Medicine

## 2023-05-30 ENCOUNTER — Encounter: Payer: Self-pay | Admitting: Cardiology

## 2023-06-18 ENCOUNTER — Encounter: Payer: Self-pay | Admitting: Family Medicine

## 2023-06-18 ENCOUNTER — Ambulatory Visit (INDEPENDENT_AMBULATORY_CARE_PROVIDER_SITE_OTHER): Payer: BC Managed Care – PPO | Admitting: Family Medicine

## 2023-06-18 VITALS — BP 90/62 | HR 68 | Temp 97.6°F | Ht 69.0 in | Wt 167.4 lb

## 2023-06-18 DIAGNOSIS — E785 Hyperlipidemia, unspecified: Secondary | ICD-10-CM | POA: Diagnosis not present

## 2023-06-18 DIAGNOSIS — Z23 Encounter for immunization: Secondary | ICD-10-CM | POA: Diagnosis not present

## 2023-06-18 DIAGNOSIS — Z0000 Encounter for general adult medical examination without abnormal findings: Secondary | ICD-10-CM | POA: Diagnosis not present

## 2023-06-18 LAB — LIPID PANEL
Cholesterol: 148 mg/dL (ref 0–200)
HDL: 48.8 mg/dL (ref >39.00)
LDL Cholesterol: 80 mg/dL (ref 0–99)
NonHDL: 98.72
Total CHOL/HDL Ratio: 3
Triglycerides: 95 mg/dL (ref 0.0–149.0)
VLDL: 19 mg/dL (ref 0.0–40.0)

## 2023-06-18 LAB — COMPREHENSIVE METABOLIC PANEL
ALT: 27 U/L (ref 0–53)
AST: 28 U/L (ref 0–37)
Albumin: 4.2 g/dL (ref 3.5–5.2)
Alkaline Phosphatase: 42 U/L (ref 39–117)
BUN: 18 mg/dL (ref 6–23)
CO2: 28 mEq/L (ref 19–32)
Calcium: 9.2 mg/dL (ref 8.4–10.5)
Chloride: 105 mEq/L (ref 96–112)
Creatinine, Ser: 1.11 mg/dL (ref 0.40–1.50)
GFR: 72.05 mL/min (ref >60.00)
Glucose, Bld: 89 mg/dL (ref 70–99)
Potassium: 4.3 mEq/L (ref 3.5–5.1)
Sodium: 139 mEq/L (ref 135–145)
Total Bilirubin: 0.8 mg/dL (ref 0.2–1.2)
Total Protein: 6.8 g/dL (ref 6.0–8.3)

## 2023-06-18 NOTE — Progress Notes (Signed)
Complete physical exam  Patient: Alexander Le   DOB: [birth year]   62 y.o. Male  MRN: [id number]  Subjective:    Chief Complaint  Patient presents with   Annual Exam    Alexander Le is a 62 y.o. male who presents today for a complete physical exam. He reports consuming a  eats a low red meat  diet. Gym/ health club routine includes light weights and Eliptical machine - 5 days a week. He generally feels well. He reports sleeping well. He does not have additional problems to discuss today.    Most recent fall risk assessment:    04/25/2022    9:26 AM  Fall Risk   Falls in the past year? 0  Number falls in past yr: 0  Injury with Fall? 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   atorvastatin (LIPITOR) 80 MG tablet Take 1 tablet (80 mg total) by mouth daily at 6 PM. (Patient taking differently: Take 40 mg by mouth daily at 6 PM.)   escitalopram (LEXAPRO) 10 MG tablet Take 1 tablet (10 mg total) by mouth daily.   Multiple Vitamin (MULTIVITAMIN WITH MINERALS) TABS tablet Take 1 tablet by mouth daily.   nitroGLYCERIN (NITROSTAT) 0.4 MG SL tablet Place 1 tablet (0.4 mg  total) under the tongue every 5 (five) minutes as needed.   No facility-administered medications prior to visit.    Review of Systems  HENT:  Negative for hearing loss.   Eyes:  Negative for blurred vision.  Respiratory:  Negative for shortness of breath.   Cardiovascular:  Negative for chest pain.  Gastrointestinal: Negative.   Genitourinary: Negative.   Musculoskeletal:  Negative for back pain.  Neurological:  Negative for headaches.  Psychiatric/Behavioral:  Negative for depression.   All other systems reviewed and are negative.      Objective:     BP 90/62 (BP Location: Left Arm, Patient Position: Sitting, Cuff Size: Normal)   Pulse 68   Temp 97.6 F (36.4 C) (Oral)   Ht 5\' 9"  (1.753 m)   Wt 167 lb 6.4 oz (75.9 kg)   SpO2 98%   BMI 24.72 kg/m    Physical Exam Vitals reviewed.  Constitutional:      Appearance: Normal appearance. He is well-groomed and normal weight.  HENT:     Right Ear: Tympanic membrane and ear canal normal.     Left Ear: Tympanic membrane and ear canal normal.     Mouth/Throat:     Mouth: Mucous membranes are moist.  Pharynx: No posterior oropharyngeal erythema.  Eyes:     Extraocular Movements: Extraocular movements intact.     Conjunctiva/sclera: Conjunctivae normal.  Neck:     Thyroid: No thyromegaly.  Cardiovascular:     Rate and Rhythm: Normal rate and regular rhythm.     Heart sounds: S1 normal and S2 normal. No murmur heard. Pulmonary:     Effort: Pulmonary effort is normal.     Breath sounds: Normal breath sounds and air entry. No rales.  Abdominal:     General: Abdomen is flat. Bowel sounds are normal.  Musculoskeletal:     Right lower leg: No edema.     Left lower leg: No edema.  Lymphadenopathy:     Cervical: No cervical adenopathy.  Neurological:     General: No focal deficit present.     Mental Status: He is alert and oriented to person, place, and time.     Gait: Gait is intact.  Psychiatric:        Mood and  Affect: Mood and affect normal.      No results found for any visits on 06/18/23.     Assessment & Plan:    Routine Health Maintenance and Physical Exam  Immunization History  Administered Date(s) Administered   Influenza,inj,Quad PF,6+ Mos 08/19/2019, 10/03/2021   PFIZER(Purple Top)SARS-COV-2 Vaccination 01/17/2020, 02/19/2020, 10/13/2020   PNEUMOCOCCAL CONJUGATE-20 10/03/2021    Health Maintenance  Topic Date Due   DTaP/Tdap/Td (1 - Tdap) Never done   Zoster Vaccines- Shingrix (1 of 2) Never done   Fecal DNA (Cologuard)  09/06/2022   COVID-19 Vaccine (4 - 2023-24 season) 07/04/2023 (Originally 07/19/2022)   INFLUENZA VACCINE  06/19/2023   Hepatitis C Screening  Completed   HIV Screening  Completed   HPV VACCINES  Aged Out   Colonoscopy  Discontinued    Discussed health benefits of physical activity, and encouraged him to engage in regular exercise appropriate for his age and condition.  Hyperlipidemia, unspecified hyperlipidemia type -     Lipid panel; Future -     Comprehensive metabolic panel; Future  Immunization due -     Tdap vaccine greater than or equal to 7yo IM -     Varicella-zoster vaccine IM  Routine general medical examination at a health care facility  Normal physical exam findings today, counseled patient on healthy sleep hygiene. Checking. annual labs today  Return in about 1 year (around 06/17/2024) for annual physical exam.     Karie Georges, MD

## 2023-06-18 NOTE — Patient Instructions (Addendum)
Consider getting the RSV vaccination  at any pharmacy. Health Maintenance, Male Adopting a healthy lifestyle and getting preventive care are important in promoting health and wellness. Ask your health care provider about: The right schedule for you to have regular tests and exams. Things you can do on your own to prevent diseases and keep yourself healthy. What should I know about diet, weight, and exercise? Eat a healthy diet  Eat a diet that includes plenty of vegetables, fruits, low-fat dairy products, and lean protein. Do not eat a lot of foods that are high in solid fats, added sugars, or sodium. Maintain a healthy weight Body mass index (BMI) is a measurement that can be used to identify possible weight problems. It estimates body fat based on height and weight. Your health care provider can help determine your BMI and help you achieve or maintain a healthy weight. Get regular exercise Get regular exercise. This is one of the most important things you can do for your health. Most adults should: Exercise for at least 150 minutes each week. The exercise should increase your heart rate and make you sweat (moderate-intensity exercise). Do strengthening exercises at least twice a week. This is in addition to the moderate-intensity exercise. Spend less time sitting. Even light physical activity can be beneficial. Watch cholesterol and blood lipids Have your blood tested for lipids and cholesterol at 61 years of age, then have this test every 5 years. You may need to have your cholesterol levels checked more often if: Your lipid or cholesterol levels are high. You are older than 61 years of age. You are at high risk for heart disease. What should I know about cancer screening? Many types of cancers can be detected early and may often be prevented. Depending on your health history and family history, you may need to have cancer screening at various ages. This may include screening  for: Colorectal cancer. Prostate cancer. Skin cancer. Lung cancer. What should I know about heart disease, diabetes, and high blood pressure? Blood pressure and heart disease High blood pressure causes heart disease and increases the risk of stroke. This is more likely to develop in people who have high blood pressure readings or are overweight. Talk with your health care provider about your target blood pressure readings. Have your blood pressure checked: Every 3-5 years if you are 53-56 years of age. Every year if you are 62 years old or older. If you are between the ages of 27 and 40 and are a current or former smoker, ask your health care provider if you should have a one-time screening for abdominal aortic aneurysm (AAA). Diabetes Have regular diabetes screenings. This checks your fasting blood sugar level. Have the screening done: Once every three years after age 8 if you are at a normal weight and have a low risk for diabetes. More often and at a younger age if you are overweight or have a high risk for diabetes. What should I know about preventing infection? Hepatitis B If you have a higher risk for hepatitis B, you should be screened for this virus. Talk with your health care provider to find out if you are at risk for hepatitis B infection. Hepatitis C Blood testing is recommended for: Everyone born from [birth year] through 1964. Anyone with known risk factors for hepatitis C. Sexually transmitted infections (STIs) You should be screened each year for STIs, including gonorrhea and chlamydia, if: You are sexually active and are younger than 61 years of age. You  are older than 61 years of age and your health care provider tells you that you are at risk for this type of infection. Your sexual activity has changed since you were last screened, and you are at increased risk for chlamydia or gonorrhea. Ask your health care provider if you are at risk. Ask your health care provider about  whether you are at high risk for HIV. Your health care provider may recommend a prescription medicine to help prevent HIV infection. If you choose to take medicine to prevent HIV, you should first get tested for HIV. You should then be tested every 3 months for as long as you are taking the medicine. Follow these instructions at home: Alcohol use Do not drink alcohol if your health care provider tells you not to drink. If you drink alcohol: Limit how much you have to 0-2 drinks a day. Know how much alcohol is in your drink. In the [place] one drink equals one 12 oz bottle of beer (355 mL), one 5 oz glass of wine (148 mL), or one 1 oz glass of hard liquor (44 mL). Lifestyle Do not use any products that contain nicotine or tobacco. These products include cigarettes, chewing tobacco, and vaping devices, such as e-cigarettes. If you need help quitting, ask your health care provider. Do not use street drugs. Do not share needles. Ask your health care provider for help if you need support or information about quitting drugs. General instructions Schedule regular health, dental, and eye exams. Stay current with your vaccines. Tell your health care provider if: You often feel depressed. You have ever been abused or do not feel safe at home. Summary Adopting a healthy lifestyle and getting preventive care are important in promoting health and wellness. Follow your health care provider's instructions about healthy diet, exercising, and getting tested or screened for diseases. Follow your health care provider's instructions on monitoring your cholesterol and blood pressure. This information is not intended to replace advice given to you by your health care provider. Make sure you discuss any questions you have with your health care provider. Document Revised: 03/26/2021 Document Reviewed: 03/26/2021 Elsevier Patient Education  2024 [employer].

## 2023-07-04 ENCOUNTER — Encounter: Payer: Self-pay | Admitting: Family Medicine

## 2023-07-04 DIAGNOSIS — Z125 Encounter for screening for malignant neoplasm of prostate: Secondary | ICD-10-CM

## 2023-07-14 ENCOUNTER — Other Ambulatory Visit: Payer: Self-pay | Admitting: Cardiology

## 2023-07-25 ENCOUNTER — Other Ambulatory Visit (INDEPENDENT_AMBULATORY_CARE_PROVIDER_SITE_OTHER): Payer: BC Managed Care – PPO

## 2023-07-25 DIAGNOSIS — Z125 Encounter for screening for malignant neoplasm of prostate: Secondary | ICD-10-CM

## 2023-07-25 LAB — PSA: PSA: 0.36 ng/mL (ref 0.10–4.00)

## 2023-08-30 ENCOUNTER — Other Ambulatory Visit: Payer: Self-pay | Admitting: Family Medicine

## 2023-09-01 ENCOUNTER — Other Ambulatory Visit: Payer: Self-pay | Admitting: Cardiology

## 2023-09-09 ENCOUNTER — Telehealth: Payer: Self-pay | Admitting: *Deleted

## 2023-09-09 MED ORDER — ESCITALOPRAM OXALATE 10 MG PO TABS: 10.0000 mg | ORAL_TABLET | Freq: Every day | ORAL | 1 refills | Status: DC

## 2023-09-09 NOTE — Telephone Encounter (Signed)
Rx done. 

## 2023-10-06 ENCOUNTER — Other Ambulatory Visit: Payer: Self-pay | Admitting: Cardiology

## 2023-10-26 ENCOUNTER — Other Ambulatory Visit: Payer: Self-pay | Admitting: Cardiology

## 2023-12-24 ENCOUNTER — Other Ambulatory Visit: Payer: Self-pay

## 2023-12-24 MED ORDER — ATORVASTATIN CALCIUM 80 MG PO TABS: 80.0000 mg | ORAL_TABLET | Freq: Every day | ORAL | 0 refills | Status: DC

## 2023-12-26 ENCOUNTER — Ambulatory Visit (INDEPENDENT_AMBULATORY_CARE_PROVIDER_SITE_OTHER): Payer: 59 | Admitting: Family Medicine

## 2023-12-26 VITALS — BP 118/74 | HR 67 | Temp 97.3°F | Resp 16 | Ht 69.0 in | Wt 173.8 lb

## 2023-12-26 DIAGNOSIS — H60392 Other infective otitis externa, left ear: Secondary | ICD-10-CM | POA: Diagnosis not present

## 2023-12-26 MED ORDER — CIPROFLOXACIN-DEXAMETHASONE 0.3-0.1 % OT SUSP
4.0000 [drp] | Freq: Two times a day (BID) | OTIC | 0 refills | Status: AC
Start: 2023-12-26 — End: 2024-01-02

## 2023-12-26 NOTE — Progress Notes (Signed)
   Acute Office Visit  Subjective:     Patient ID: Alexander Le, male    DOB: 1961-07-25, 63 y.o.   MRN: [id number]  Chief Complaint  Patient presents with   Ear Pain    Left ear. Started 1 week ago. Noticed some blood drainage    Pt is reporting 1 week history of left ear pain, noticed some bloody discharge? From the ear. No recent plane rides, no viral illnesses, no fever or chills, no sore throat or nasal congestion. States that it is fairly constant,     Review of Systems  Constitutional:  Negative for chills, fever and malaise/fatigue.  HENT:  Positive for ear discharge and ear pain. Negative for congestion, hearing loss, sinus pain, sore throat and tinnitus.         Objective:    BP 118/74 (BP Location: Left Arm, Patient Position: Sitting, Cuff Size: Normal)   Pulse 67   Temp (!) 97.3 F (36.3 C) (Oral)   Resp 16   Ht 5' 9 (1.753 m)   Wt 173 lb 12.8 oz (78.8 kg)   SpO2 94%   BMI 25.67 kg/m    Physical Exam Vitals reviewed.  Constitutional:      Appearance: Normal appearance. He is normal weight.  HENT:     Right Ear: Tympanic membrane normal.     Left Ear: Drainage and swelling present.  No middle ear effusion. There is no impacted cerumen.  Neurological:     Mental Status: He is alert.     No results found for any visits on 12/26/23.      Assessment & Plan:   Problem List Items Addressed This Visit   None Visit Diagnoses       Other infective acute otitis externa of left ear    -  Primary   Relevant Medications   ciprofloxacin -dexamethasone  (CIPRODEX ) OTIC suspension     Ear canal is erthematous and edematous, it is difficult to see the TM due to the EAC swelling, there is also purulent debris in the ear canal. Definitely infection present, will call in ciprodex  ear drops BID for 7 days. If pt is not better then he should message me and he might need oral abx if no improvement.   Meds ordered this encounter  Medications    ciprofloxacin -dexamethasone  (CIPRODEX ) OTIC suspension    Sig: Place 4 drops into the left ear 2 (two) times daily for 7 days.    Dispense:  7.5 mL    Refill:  0    No follow-ups on file.  Heron CHRISTELLA Sharper, MD

## 2024-01-30 IMAGING — DX DG CHEST 1V PORT
1 series · 1 of 1 positions shown · non-contrast
Comparison: 02/20/2019

CLINICAL DATA: Chest pain, shortness of breath

EXAM:
PORTABLE CHEST 1 VIEW

[chest ap]
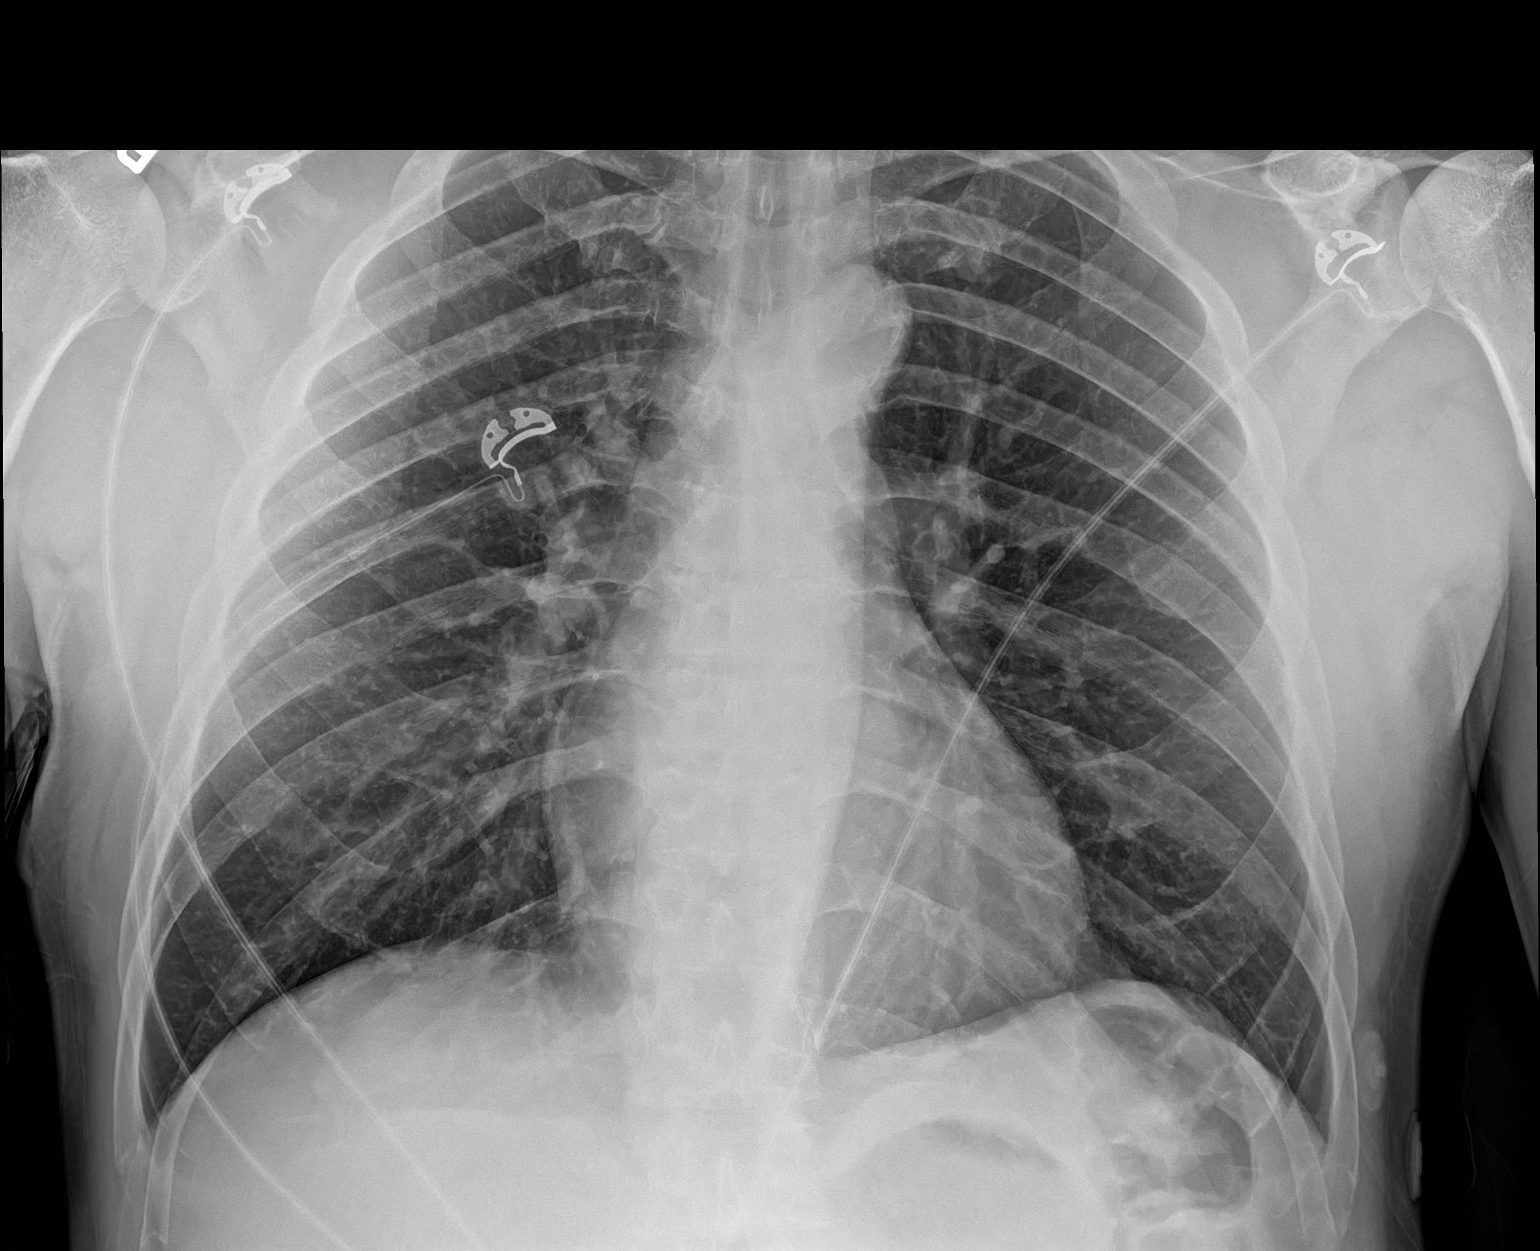

[1 of 1 positions shown; findings below may reference images not displayed]

FINDINGS: The heart size and mediastinal contours are within normal limits.
Both lungs are clear. The visualized skeletal structures are
unremarkable.
IMPRESSION: No acute abnormality of the lungs in AP portable projection.

## 2024-03-06 ENCOUNTER — Other Ambulatory Visit: Payer: Self-pay | Admitting: Family Medicine

## 2024-03-16 ENCOUNTER — Encounter: Payer: Self-pay | Admitting: Family Medicine

## 2024-03-16 ENCOUNTER — Ambulatory Visit (INDEPENDENT_AMBULATORY_CARE_PROVIDER_SITE_OTHER): Admitting: Family Medicine

## 2024-03-16 VITALS — BP 110/72 | HR 69 | Temp 97.9°F | Ht 69.0 in | Wt 169.4 lb

## 2024-03-16 DIAGNOSIS — L239 Allergic contact dermatitis, unspecified cause: Secondary | ICD-10-CM | POA: Diagnosis not present

## 2024-03-16 MED ORDER — CLOBETASOL PROPIONATE 0.05 % EX OINT: 1.0000 | TOPICAL_OINTMENT | Freq: Two times a day (BID) | CUTANEOUS | 0 refills | Status: DC

## 2024-03-16 NOTE — Progress Notes (Unsigned)
   Acute Office Visit  Subjective:     Patient ID: Alexander Le, male    DOB: 09/28/1961, 63 y.o.   MRN: [id number]  Chief Complaint  Patient presents with   Mass    Patient complains of a red bump and clear drainage along the left index finger x1 week, questioned if this could be due to bug bite as he was doing yard work    HPI Patient is in today for red lesion on the left index finger on the inside. States that he was doing yard work and then he noticed the bumps the next day. States that there is no pain, no itching or burning of the lesion. There is some clear drainage from it but no bloody or purulent discharge. No redness or swelling. He states he has some other lesions on the other arm but they are different, slightly raised and itchy but no other blisters/vesicles anywhere else. Pt states he has had shingles on the right hip in the past and this is not the same.   Review of Systems  All other systems reviewed and are negative.       Objective:    BP 110/72   Pulse 69   Temp 97.9 F (36.6 C) (Oral)   Ht 5\' 9"  (1.753 m)   Wt 169 lb 6.4 oz (76.8 kg)   SpO2 98%   BMI 25.02 kg/m    Physical Exam Neurological:     Mental Status: He is oriented to person, place, and time. Mental status is at baseline.     No results found for any visits on 03/16/24.      Assessment & Plan:   Problem List Items Addressed This Visit   None Visit Diagnoses       Allergic contact dermatitis, unspecified trigger    -  Primary   Relevant Medications   clobetasol ointment (TEMOVATE) 0.05 %     Could possibly be a blister or some other dermatitis from the yard work he was doing, also possibly looks like herpetic whitlow? This is less likely. Will prescribed clobetasol ointment for the lesion twice daily and see if it improved.   Meds ordered this encounter  Medications   clobetasol ointment (TEMOVATE) 0.05 %    Sig: Apply 1 Application topically 2 (two) times daily.     Dispense:  30 g    Refill:  0    No follow-ups on file.  Aida House, MD

## 2024-05-10 ENCOUNTER — Other Ambulatory Visit: Payer: Self-pay

## 2024-05-10 ENCOUNTER — Telehealth: Payer: Self-pay | Admitting: Cardiology

## 2024-05-10 ENCOUNTER — Ambulatory Visit: Admitting: Family Medicine

## 2024-05-10 ENCOUNTER — Encounter: Payer: Self-pay | Admitting: Family Medicine

## 2024-05-10 VITALS — BP 104/78 | HR 74 | Temp 97.9°F | Ht 69.0 in | Wt 172.2 lb

## 2024-05-10 DIAGNOSIS — R0981 Nasal congestion: Secondary | ICD-10-CM

## 2024-05-10 DIAGNOSIS — H66002 Acute suppurative otitis media without spontaneous rupture of ear drum, left ear: Secondary | ICD-10-CM

## 2024-05-10 DIAGNOSIS — R051 Acute cough: Secondary | ICD-10-CM | POA: Diagnosis not present

## 2024-05-10 DIAGNOSIS — R413 Other amnesia: Secondary | ICD-10-CM

## 2024-05-10 DIAGNOSIS — F39 Unspecified mood [affective] disorder: Secondary | ICD-10-CM

## 2024-05-10 MED ORDER — NITROGLYCERIN 0.4 MG SL SUBL: 0.4000 mg | SUBLINGUAL_TABLET | SUBLINGUAL | 2 refills | Status: AC | PRN

## 2024-05-10 MED ORDER — ESCITALOPRAM OXALATE 10 MG PO TABS: 10.0000 mg | ORAL_TABLET | Freq: Every day | ORAL | 0 refills | Status: DC

## 2024-05-10 MED ORDER — AMOXICILLIN-POT CLAVULANATE 500-125 MG PO TABS: 1.0000 | ORAL_TABLET | Freq: Two times a day (BID) | ORAL | 0 refills | Status: AC

## 2024-05-10 NOTE — Telephone Encounter (Signed)
*  STAT* If patient is at the pharmacy, call can be transferred to refill team.   1. Which medications need to be refilled? (please list name of each medication and dose if known)  nitroGLYCERIN  (NITROSTAT ) 0.4 MG SL tablet   2. Which pharmacy/location (including street and city if local pharmacy) is medication to be sent to? [id number] - [place], [place] - [address]    3. Do they need a 30 day or 90 day supply?  Standard emergency supply  Wife says current prescription is expired.

## 2024-05-10 NOTE — Progress Notes (Signed)
 Established Patient Office Visit   Subjective  Patient ID: Alexander Le, male    DOB: May 20, 1961  Age: 63 y.o. MRN: [id number]  Chief Complaint  Patient presents with   Medical Management of Chronic Issues    Cough congestion, nasal drainage, started 2 weeks ago,     Patient is a 63 year old male followed by Dr. Ozell and seen for ongoing concern.  Patient endorses cough, nasal drainage and congestion x 1.5-2 weeks.  Symptoms initially started as sore throat which has resolved.  Patient denies fever, chills, facial pain/pressure, headaches, ear pain/pressure, nausea, vomiting, diarrhea.  Patient states his wife had similar symptoms then he became sick.  Patient states mucus initially green in color then clear now with slight green discoloration.  Patient concerned is going to [place] for 30 days next week.  Requesting refill on Lexapro  as well ran out prior to being able to pick up new Rx.    Patient Active Problem List   Diagnosis Date Noted   CAD (coronary artery disease) 04/02/2021   Memory change 08/13/2019   Hyperlipidemia 02/22/2019   NSTEMI (non-ST elevated myocardial infarction) (HCC) 02/20/2019   Past Medical History:  Diagnosis Date   Hyperlipidemia    NSTEMI (non-ST elevated myocardial infarction) (HCC)    02/21/19 PCI/DES to 100% mRCA, 65% in pLCX, normal EF   Past Surgical History:  Procedure Laterality Date   COLONOSCOPY  01/03/2023   CORONARY STENT INTERVENTION N/A 02/22/2019   Procedure: CORONARY STENT INTERVENTION;  Surgeon: Swaziland, [name], MD;  Location: [facility] INVASIVE CV LAB;  Service: Cardiovascular;  Laterality: N/A;   KIDNEY SURGERY     ureter surgery to help with drainage   KNEE ARTHROSCOPY WITH ANTERIOR CRUCIATE LIGAMENT (ACL) REPAIR Left    LEFT HEART CATH AND CORONARY ANGIOGRAPHY N/A 02/22/2019   Procedure: LEFT HEART CATH AND CORONARY ANGIOGRAPHY;  Surgeon: Swaziland, [name], MD;  Location: [facility] INVASIVE CV LAB;  Service: Cardiovascular;  Laterality:  N/A;   Social History   Tobacco Use   Smoking status: Never   Smokeless tobacco: Never  Vaping Use   Vaping status: Never Used  Substance Use Topics   Alcohol use: Yes    Alcohol/week: 2.0 standard drinks of alcohol    Types: 2 Glasses of wine per week    Comment: rare   Drug use: Never   Family History  Problem Relation Age of Onset   Heart attack Mother 36   Other Father        didn't know father   Heart attack Brother        85s   Renal Disease Brother    Other Brother        unknown/?overdose   Dementia Maternal Grandmother    Colon polyps Neg Hx    Crohn's disease Neg Hx    Esophageal cancer Neg Hx    Rectal cancer Neg Hx    Stomach cancer Neg Hx    Colon cancer Neg Hx    No Known Allergies  ROS Negative unless stated above    Objective:     BP 104/78 (BP Location: Left Arm, Patient Position: Sitting, Cuff Size: Normal)   Pulse 74   Temp 97.9 F (36.6 C) (Oral)   Ht 5' 9 (1.753 m)   Wt 172 lb 3.2 oz (78.1 kg)   SpO2 94%   BMI 25.43 kg/m  BP Readings from Last 3 Encounters:  05/10/24 104/78  03/16/24 110/72  12/26/23 118/74  Wt Readings from Last 3 Encounters:  05/10/24 172 lb 3.2 oz (78.1 kg)  03/16/24 169 lb 6.4 oz (76.8 kg)  12/26/23 173 lb 12.8 oz (78.8 kg)      Physical Exam Constitutional:      General: He is not in acute distress.    Appearance: Normal appearance.  HENT:     Head: Normocephalic and atraumatic.     Right Ear: Tympanic membrane normal.     Ears:     Comments: Scant cerumen in right canal.  Left TM with mild erythema and suppurative fluid in upper right corner.  Nonbulging.    Nose: Nose normal.     Mouth/Throat:     Mouth: Mucous membranes are moist.     Pharynx: Postnasal drip present.   Cardiovascular:     Rate and Rhythm: Normal rate and regular rhythm.     Heart sounds: Normal heart sounds. No murmur heard.    No gallop.  Pulmonary:     Effort: Pulmonary effort is normal. No respiratory distress.      Breath sounds: Normal breath sounds. No wheezing, rhonchi or rales.   Skin:    General: Skin is warm and dry.   Neurological:     Mental Status: He is alert and oriented to person, place, and time.        06/18/2023    8:07 AM 05/08/2023    2:04 PM 04/25/2022    9:26 AM  Depression screen PHQ 2/9  Decreased Interest 0 0 0  Down, Depressed, Hopeless 0 0 0  PHQ - 2 Score 0 0 0  Altered sleeping 0  0  Tired, decreased energy 0  1  Change in appetite 0  0  Feeling bad or failure about yourself  0  0  Trouble concentrating 0  0  Moving slowly or fidgety/restless 0  0  Suicidal thoughts 0  0  PHQ-9 Score 0  1  Difficult doing work/chores   Not difficult at all     No results found for any visits on 05/10/24.    Assessment & Plan:   Acute suppurative otitis media of left ear without spontaneous rupture of tympanic membrane, recurrence not specified -     Amoxicillin-Pot Clavulanate; Take 1 tablet by mouth in the morning and at bedtime for 7 days.  Dispense: 14 tablet; Refill: 0  Nasal congestion -     Amoxicillin-Pot Clavulanate; Take 1 tablet by mouth in the morning and at bedtime for 7 days.  Dispense: 14 tablet; Refill: 0  Acute cough  Memory change -     Escitalopram  Oxalate; Take 1 tablet (10 mg total) by mouth daily.  Dispense: 30 tablet; Refill: 0  Mood disorder (HCC) -     Escitalopram  Oxalate; Take 1 tablet (10 mg total) by mouth daily.  Dispense: 30 tablet; Refill: 0  Start ABX for AOM left ear.  Patient encouraged to use Flonase nasal spray and OTC antihistamine to help with nasal congestion, postnasal drainage, cough.  Continue supportive care.  Follow-up with PCP for continued or worsening symptoms.  A 30-day temporary refill provided for Lexapro  10 mg as patient will be out of the [place] when next refill is due.  Follow-up with PCP for continued or worsening symptoms.  Return if symptoms worsen or fail to improve.   Clotilda JONELLE Single, MD

## 2024-05-10 NOTE — Patient Instructions (Signed)
 A30-day supply of Lexapro  was sent to your pharmacy so that you will not run out while you are out of the country.

## 2024-05-10 NOTE — Telephone Encounter (Signed)
 Med refilled.. pt has 08/20/24 OV.

## 2024-05-10 NOTE — Telephone Encounter (Signed)
 Pt of Dr. Jens Som. Passed his 3rd attempt. Does Dr. Jens Som want to refill? Please advise

## 2024-06-11 ENCOUNTER — Other Ambulatory Visit: Payer: Self-pay | Admitting: Family Medicine

## 2024-06-11 DIAGNOSIS — R413 Other amnesia: Secondary | ICD-10-CM

## 2024-06-11 DIAGNOSIS — F39 Unspecified mood [affective] disorder: Secondary | ICD-10-CM

## 2024-08-09 NOTE — Progress Notes (Deleted)
 HPI: Follow-up coronary artery disease. Admitted April 2020 and ruled in for non-ST elevation myocardial infarction. Cardiac catheterization revealed a 65% circumflex, 50% first marginal, occluded right coronary artery and normal LV function.  Patient had PCI of RCA with drug-eluting stent. Echocardiogram April 2020 showed ejection fraction 55 to 60%, moderate RV dysfunction, mild right atrial enlargement.  PET scan July 2023 showed ejection fraction at rest of 52% and 62% at stress.  Perfusion was normal.  Since last seen   Current Outpatient Medications  Medication Sig Dispense Refill   aspirin  EC 81 MG tablet Take 81 mg by mouth daily.     atorvastatin  (LIPITOR ) 80 MG tablet Take 1 tablet (80 mg total) by mouth daily at 6 PM. 15 tablet 0   clobetasol  ointment (TEMOVATE ) 0.05 % Apply 1 Application topically 2 (two) times daily. 30 g 0   escitalopram  (LEXAPRO ) 10 MG tablet TAKE 1 TABLET(10 MG) BY MOUTH DAILY 90 tablet 1   escitalopram  (LEXAPRO ) 10 MG tablet TAKE 1 TABLET(10 MG) BY MOUTH DAILY 90 tablet 1   Multiple Vitamin (MULTIVITAMIN WITH MINERALS) TABS tablet Take 1 tablet by mouth daily.     nitroGLYCERIN  (NITROSTAT ) 0.4 MG SL tablet Place 1 tablet (0.4 mg total) under the tongue every 5 (five) minutes as needed. 25 tablet 2   No current facility-administered medications for this visit.     Past Medical History:  Diagnosis Date   Hyperlipidemia    NSTEMI (non-ST elevated myocardial infarction) (HCC)    02/21/19 PCI/DES to 100% mRCA, 65% in pLCX, normal EF    Past Surgical History:  Procedure Laterality Date   COLONOSCOPY  01/03/2023   CORONARY STENT INTERVENTION N/A 02/22/2019   Procedure: CORONARY STENT INTERVENTION;  Surgeon: Swaziland, [name], MD;  Location: [facility] INVASIVE CV LAB;  Service: Cardiovascular;  Laterality: N/A;   KIDNEY SURGERY     ureter surgery to help with drainage   KNEE ARTHROSCOPY WITH ANTERIOR CRUCIATE LIGAMENT (ACL) REPAIR Left    LEFT HEART CATH AND  CORONARY ANGIOGRAPHY N/A 02/22/2019   Procedure: LEFT HEART CATH AND CORONARY ANGIOGRAPHY;  Surgeon: Swaziland, [name], MD;  Location: [facility] INVASIVE CV LAB;  Service: Cardiovascular;  Laterality: N/A;    Social History   Socioeconomic History   Marital status: Married    Spouse name: Not on file   Number of children: Not on file   Years of education: Not on file   Highest education level: Not on file  Occupational History   Not on file  Tobacco Use   Smoking status: Never   Smokeless tobacco: Never  Vaping Use   Vaping status: Never Used  Substance and Sexual Activity   Alcohol use: Yes    Alcohol/week: 2.0 standard drinks of alcohol    Types: 2 Glasses of wine per week    Comment: rare   Drug use: Never   Sexual activity: Yes  Other Topics Concern   Not on file  Social History Narrative   Not on file   Social Drivers of Health   Financial Resource Strain: Not on file  Food Insecurity: Not on file  Transportation Needs: Not on file  Physical Activity: Not on file  Stress: Not on file  Social Connections: Not on file  Intimate Partner Violence: Not on file    Family History  Problem Relation Age of Onset   Heart attack Mother 48   Other Father        didn't know father  Heart attack Brother        64s   Renal Disease Brother    Other Brother        unknown/?overdose   Dementia Maternal Grandmother    Colon polyps Neg Hx    Crohn's disease Neg Hx    Esophageal cancer Neg Hx    Rectal cancer Neg Hx    Stomach cancer Neg Hx    Colon cancer Neg Hx     ROS: no fevers or chills, productive cough, hemoptysis, dysphasia, odynophagia, melena, hematochezia, dysuria, hematuria, rash, seizure activity, orthopnea, PND, pedal edema, claudication. Remaining systems are negative.  Physical Exam: Well-developed well-nourished in no acute distress.  Skin is warm and dry.  HEENT is normal.  Neck is supple.  Chest is clear to auscultation with normal expansion.   Cardiovascular exam is regular rate and rhythm.  Abdominal exam nontender or distended. No masses palpated. Extremities show no edema. neuro grossly intact  ECG- personally reviewed  A/P  1 coronary artery disease-patient denies chest pain and most recent PET scan showed no ischemia.  Continue aspirin  and statin.  2 hyperlipidemia-continue statin.    Redell Shallow, MD

## 2024-08-20 ENCOUNTER — Ambulatory Visit: Admitting: Cardiology

## 2024-09-06 ENCOUNTER — Other Ambulatory Visit: Payer: Self-pay | Admitting: Cardiology

## 2024-10-08 ENCOUNTER — Ambulatory Visit: Payer: Self-pay | Admitting: Family Medicine

## 2024-10-08 ENCOUNTER — Encounter: Payer: Self-pay | Admitting: Family Medicine

## 2024-10-08 ENCOUNTER — Ambulatory Visit (INDEPENDENT_AMBULATORY_CARE_PROVIDER_SITE_OTHER): Admitting: Family Medicine

## 2024-10-08 VITALS — BP 98/50 | HR 59 | Temp 97.7°F | Ht 69.25 in | Wt 178.5 lb

## 2024-10-08 DIAGNOSIS — Z0000 Encounter for general adult medical examination without abnormal findings: Secondary | ICD-10-CM

## 2024-10-08 DIAGNOSIS — Z125 Encounter for screening for malignant neoplasm of prostate: Secondary | ICD-10-CM

## 2024-10-08 DIAGNOSIS — E785 Hyperlipidemia, unspecified: Secondary | ICD-10-CM | POA: Diagnosis not present

## 2024-10-08 LAB — COMPREHENSIVE METABOLIC PANEL WITH GFR
ALT: 38 U/L (ref 0–53)
AST: 34 U/L (ref 0–37)
Albumin: 4.2 g/dL (ref 3.5–5.2)
Alkaline Phosphatase: 47 U/L (ref 39–117)
BUN: 21 mg/dL (ref 6–23)
CO2: 28 meq/L (ref 19–32)
Calcium: 8.4 mg/dL (ref 8.4–10.5)
Chloride: 105 meq/L (ref 96–112)
Creatinine, Ser: 1.23 mg/dL (ref 0.40–1.50)
GFR: 63.12 mL/min (ref >60.00)
Glucose, Bld: 84 mg/dL (ref 70–99)
Potassium: 4.4 meq/L (ref 3.5–5.1)
Sodium: 141 meq/L (ref 135–145)
Total Bilirubin: 0.7 mg/dL (ref 0.2–1.2)
Total Protein: 6.6 g/dL (ref 6.0–8.3)

## 2024-10-08 LAB — LIPID PANEL
Cholesterol: 121 mg/dL (ref 0–200)
HDL: 45.4 mg/dL (ref >39.00)
LDL Cholesterol: 62 mg/dL (ref 0–99)
NonHDL: 75.33
Total CHOL/HDL Ratio: 3
Triglycerides: 67 mg/dL (ref 0.0–149.0)
VLDL: 13.4 mg/dL (ref 0.0–40.0)

## 2024-10-08 LAB — PSA: PSA: 0.33 ng/mL (ref 0.10–4.00)

## 2024-10-08 MED ORDER — ATORVASTATIN CALCIUM 80 MG PO TABS: 80.0000 mg | ORAL_TABLET | Freq: Every day | ORAL | 1 refills | Status: AC

## 2024-10-08 NOTE — Patient Instructions (Signed)
 Health Maintenance, Male  Adopting a healthy lifestyle and getting preventive care are important in promoting health and wellness. Ask your health care provider about:  The right schedule for you to have regular tests and exams.  Things you can do on your own to prevent diseases and keep yourself healthy.  What should I know about diet, weight, and exercise?  Eat a healthy diet    Eat a diet that includes plenty of vegetables, fruits, low-fat dairy products, and lean protein.  Do not eat a lot of foods that are high in solid fats, added sugars, or sodium.  Maintain a healthy weight  Body mass index (BMI) is a measurement that can be used to identify possible weight problems. It estimates body fat based on height and weight. Your health care provider can help determine your BMI and help you achieve or maintain a healthy weight.  Get regular exercise  Get regular exercise. This is one of the most important things you can do for your health. Most adults should:  Exercise for at least 150 minutes each week. The exercise should increase your heart rate and make you sweat (moderate-intensity exercise).  Do strengthening exercises at least twice a week. This is in addition to the moderate-intensity exercise.  Spend less time sitting. Even light physical activity can be beneficial.  Watch cholesterol and blood lipids  Have your blood tested for lipids and cholesterol at 62 years of age, then have this test every 5 years.  You may need to have your cholesterol levels checked more often if:  Your lipid or cholesterol levels are high.  You are older than 62 years of age.  You are at high risk for heart disease.  What should I know about cancer screening?  Many types of cancers can be detected early and may often be prevented. Depending on your health history and family history, you may need to have cancer screening at various ages. This may include screening for:  Colorectal cancer.  Prostate cancer.  Skin cancer.  Lung  cancer.  What should I know about heart disease, diabetes, and high blood pressure?  Blood pressure and heart disease  High blood pressure causes heart disease and increases the risk of stroke. This is more likely to develop in people who have high blood pressure readings or are overweight.  Talk with your health care provider about your target blood pressure readings.  Have your blood pressure checked:  Every 3-5 years if you are 24-52 years of age.  Every year if you are 3 years old or older.  If you are between the ages of 60 and 72 and are a current or former smoker, ask your health care provider if you should have a one-time screening for abdominal aortic aneurysm (AAA).  Diabetes  Have regular diabetes screenings. This checks your fasting blood sugar level. Have the screening done:  Once every three years after age 66 if you are at a normal weight and have a low risk for diabetes.  More often and at a younger age if you are overweight or have a high risk for diabetes.  What should I know about preventing infection?  Hepatitis B  If you have a higher risk for hepatitis B, you should be screened for this virus. Talk with your health care provider to find out if you are at risk for hepatitis B infection.  Hepatitis C  Blood testing is recommended for:  Everyone born from [birth year] through 1964.  Anyone  with known risk factors for hepatitis C.  Sexually transmitted infections (STIs)  You should be screened each year for STIs, including gonorrhea and chlamydia, if:  You are sexually active and are younger than 62 years of age.  You are older than 62 years of age and your health care provider tells you that you are at risk for this type of infection.  Your sexual activity has changed since you were last screened, and you are at increased risk for chlamydia or gonorrhea. Ask your health care provider if you are at risk.  Ask your health care provider about whether you are at high risk for HIV. Your health care provider  may recommend a prescription medicine to help prevent HIV infection. If you choose to take medicine to prevent HIV, you should first get tested for HIV. You should then be tested every 3 months for as long as you are taking the medicine.  Follow these instructions at home:  Alcohol use  Do not drink alcohol if your health care provider tells you not to drink.  If you drink alcohol:  Limit how much you have to 0-2 drinks a day.  Know how much alcohol is in your drink. In the [place] one drink equals one 12 oz bottle of beer (355 mL), one 5 oz glass of wine (148 mL), or one 1 oz glass of hard liquor (44 mL).  Lifestyle  Do not use any products that contain nicotine or tobacco. These products include cigarettes, chewing tobacco, and vaping devices, such as e-cigarettes. If you need help quitting, ask your health care provider.  Do not use street drugs.  Do not share needles.  Ask your health care provider for help if you need support or information about quitting drugs.  General instructions  Schedule regular health, dental, and eye exams.  Stay current with your vaccines.  Tell your health care provider if:  You often feel depressed.  You have ever been abused or do not feel safe at home.  Summary  Adopting a healthy lifestyle and getting preventive care are important in promoting health and wellness.  Follow your health care provider's instructions about healthy diet, exercising, and getting tested or screened for diseases.  Follow your health care provider's instructions on monitoring your cholesterol and blood pressure.  This information is not intended to replace advice given to you by your health care provider. Make sure you discuss any questions you have with your health care provider.  Document Revised: 03/26/2021 Document Reviewed: 03/26/2021  Elsevier Patient Education  2024 [employer].

## 2024-10-08 NOTE — Progress Notes (Signed)
 Complete physical exam  Patient: Alexander Le   DOB: 06/13/1961   63 y.o. Male  MRN: [id number]  Subjective:    Chief Complaint  Patient presents with   Annual Exam    Alexander Le is a 63 y.o. male who presents today for a complete physical exam. He reports consuming a general diet. Gets fruits and veggies daily, eating mostly chicken for protein, occasional fish or eggs. Eats dairy, cheese mostly. Gym/ health club routine includes [employer] and free weights 4-5 days per week. He generally feels well. He reports sleeping fairly well. He does not have additional problems to discuss today.    Most recent fall risk assessment:    04/25/2022    9:26 AM  Fall Risk   Falls in the past year? 0  Number falls in past yr: 0  Injury with Fall? 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   escitalopram  (LEXAPRO ) 10 MG tablet TAKE 1 TABLET(10 MG) BY MOUTH DAILY   escitalopram  (LEXAPRO ) 10 MG tablet TAKE 1 TABLET(10  MG) BY MOUTH DAILY   Multiple Vitamin (MULTIVITAMIN WITH MINERALS) TABS tablet Take 1 tablet by mouth daily.   nitroGLYCERIN  (NITROSTAT ) 0.4 MG SL tablet Place 1 tablet (0.4 mg total) under the tongue every 5 (five) minutes as needed.   [DISCONTINUED] atorvastatin  (LIPITOR ) 80 MG tablet Take 1 tablet (80 mg total) by mouth daily at 6 PM.   [DISCONTINUED] clobetasol  ointment (TEMOVATE ) 0.05 % Apply 1 Application topically 2 (two) times daily.   No facility-administered medications prior to visit.    Review of Systems  HENT:  Negative for hearing loss.   Eyes:  Negative for blurred vision.  Respiratory:  Negative for shortness of breath.   Cardiovascular:  Negative for chest pain.  Gastrointestinal: Negative.   Genitourinary: Negative.   Musculoskeletal:  Negative for back pain.  Neurological:  Negative for headaches.  Psychiatric/Behavioral:  Negative for depression.   All other systems reviewed and are negative.      Objective:     BP (!) 98/50   Pulse (!) 59   Temp 97.7 F (36.5 C) (Oral)   Ht 5' 9.25 (1.759 m)   Wt 178 lb 8 oz (81 kg)   SpO2 98%   BMI 26.17 kg/m     Physical Exam Vitals reviewed.  Constitutional:  Appearance: Normal appearance. He is well-groomed and normal weight.  HENT:     Right Ear: Tympanic membrane and ear canal normal.     Left Ear: Tympanic membrane and ear canal normal.     Mouth/Throat:     Mouth: Mucous membranes are moist.     Pharynx: No posterior oropharyngeal erythema.  Eyes:     Extraocular Movements: Extraocular movements intact.     Conjunctiva/sclera: Conjunctivae normal.  Neck:     Thyroid: No thyromegaly.  Cardiovascular:     Rate and Rhythm: Normal rate and regular rhythm.     Heart sounds: S1 normal and S2 normal. No murmur heard. Pulmonary:     Effort: Pulmonary effort is normal.     Breath sounds: Normal breath sounds and air entry. No rales.  Abdominal:     General: Abdomen is flat. Bowel sounds are normal.   Musculoskeletal:     Right lower leg: No edema.     Left lower leg: No edema.  Lymphadenopathy:     Cervical: No cervical adenopathy.  Neurological:     General: No focal deficit present.     Mental Status: He is alert and oriented to person, place, and time.     Gait: Gait is intact.  Psychiatric:        Mood and Affect: Mood and affect normal.      No results found for any visits on 10/08/24.      Assessment & Plan:    Routine Health Maintenance and Physical Exam  Immunization History  Administered Date(s) Administered   Influenza,inj,Quad PF,6+ Mos 08/19/2019, 10/03/2021   PFIZER(Purple Top)SARS-COV-2 Vaccination 01/17/2020, 02/19/2020, 10/13/2020   PNEUMOCOCCAL CONJUGATE-20 10/03/2021   Tdap 06/18/2023   Zoster Recombinant(Shingrix ) 06/18/2023    Health Maintenance  Topic Date Due   Zoster Vaccines- Shingrix  (2 of 2) 08/13/2023   COVID-19 Vaccine (4 - 2025-26 season) 07/19/2024   Influenza Vaccine  02/15/2025 (Originally 06/18/2024)   Colonoscopy  01/03/2033   DTaP/Tdap/Td (2 - Td or Tdap) 06/17/2033   Pneumococcal Vaccine: 50+ Years  Completed   Hepatitis C Screening  Completed   HIV Screening  Completed   Hepatitis B Vaccines 19-59 Average Risk  Aged Out   HPV VACCINES  Aged Out   Meningococcal B Vaccine  Aged Out   Fecal DNA (Cologuard)  Discontinued    Discussed health benefits of physical activity, and encouraged him to engage in regular exercise appropriate for his age and condition.  Hyperlipidemia, unspecified hyperlipidemia type -     Lipid panel; Future -     Comprehensive metabolic panel with GFR; Future -     Atorvastatin  Calcium ; Take 1 tablet (80 mg total) by mouth daily at 6 PM.  Dispense: 90 tablet; Refill: 1  Routine general medical examination at a health care facility  Prostate cancer screening -     PSA; Future   General physical exam findings are normal today. I reviewed the patient's preventative testing, immunizations, and  lifestyle habits. I made appropriate recommendations and placed orders for the appropriate tests and/or vaccinations. I counseled the patient on the CDC's recommendations for healthy exercise and diet. I counseled the patient on healthy sleep habits and stress management. Handouts to reinforce the counseling were given at the conclusion of the visit.   Return in 1 year (on 10/08/2025).     Heron CHRISTELLA Sharper, MD
